# Patient Record
Sex: Female | Born: 1995 | Race: White | Hispanic: No | Marital: Single | Smoking: Former smoker
Health system: Southern US, Community
[De-identification: ages and names within clinical notes are randomized; demographics above are authoritative.]

## PROBLEM LIST (undated history)

## (undated) DIAGNOSIS — F419 Anxiety disorder, unspecified: Secondary | ICD-10-CM

## (undated) HISTORY — PX: NO PAST SURGERIES: SHX2092

## (undated) HISTORY — DX: Anxiety disorder, unspecified: F41.9

---
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
## 2015-01-11 DIAGNOSIS — E669 Obesity, unspecified: Secondary | ICD-10-CM | POA: Insufficient documentation

## 2015-03-01 IMAGING — CR DG ANKLE COMPLETE 3+V*L*
1 series · 5 of 5 positions shown · non-contrast
Comparison: none

REASON FOR EXAM: intermittent left ankle pain
COMMENTS:

PROCEDURE:     DXR - DXR ANKLE LEFT COMPLETE  - November 14, 2012  [DATE]
RESULT:     Comparison: None.

[Series 1: x ankle ap left · 0.14mm/px · 5 of 5 slices shown]
[im 1/5]
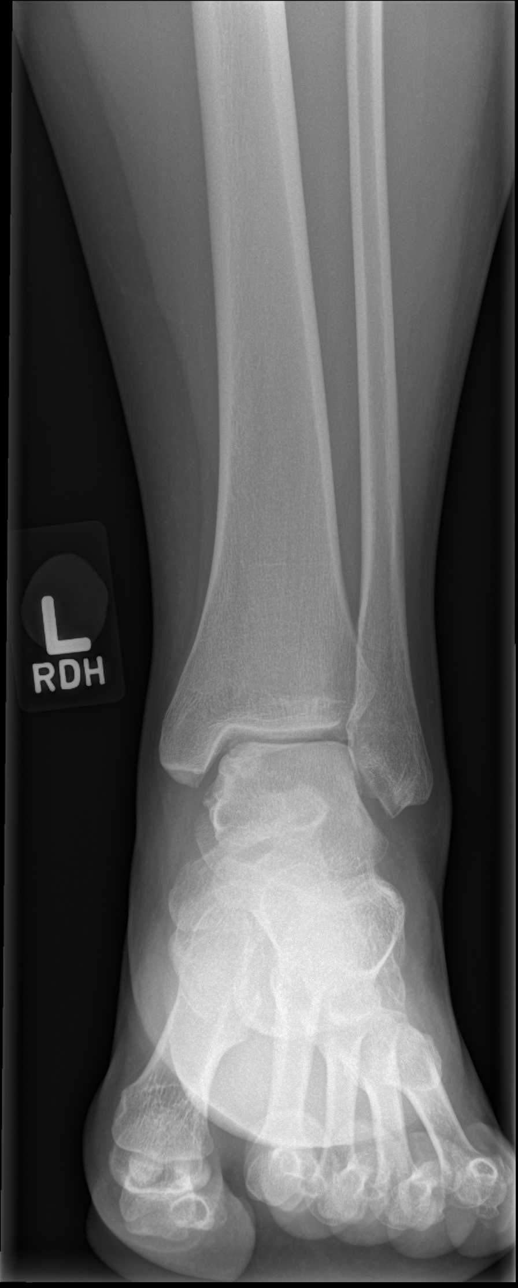
[im 2/5]
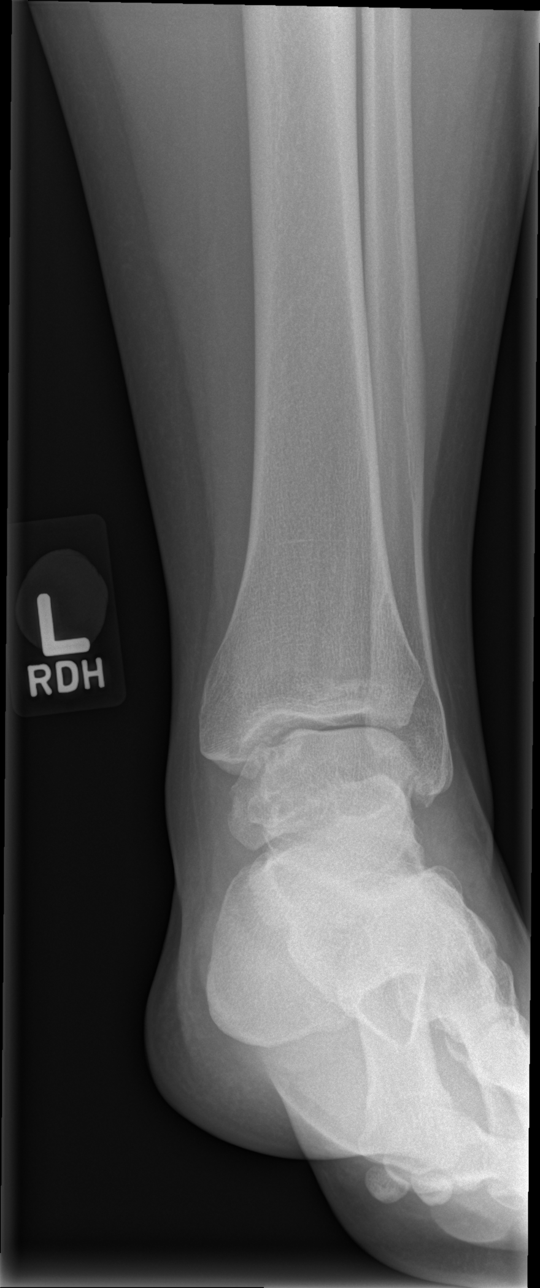
[im 3/5]
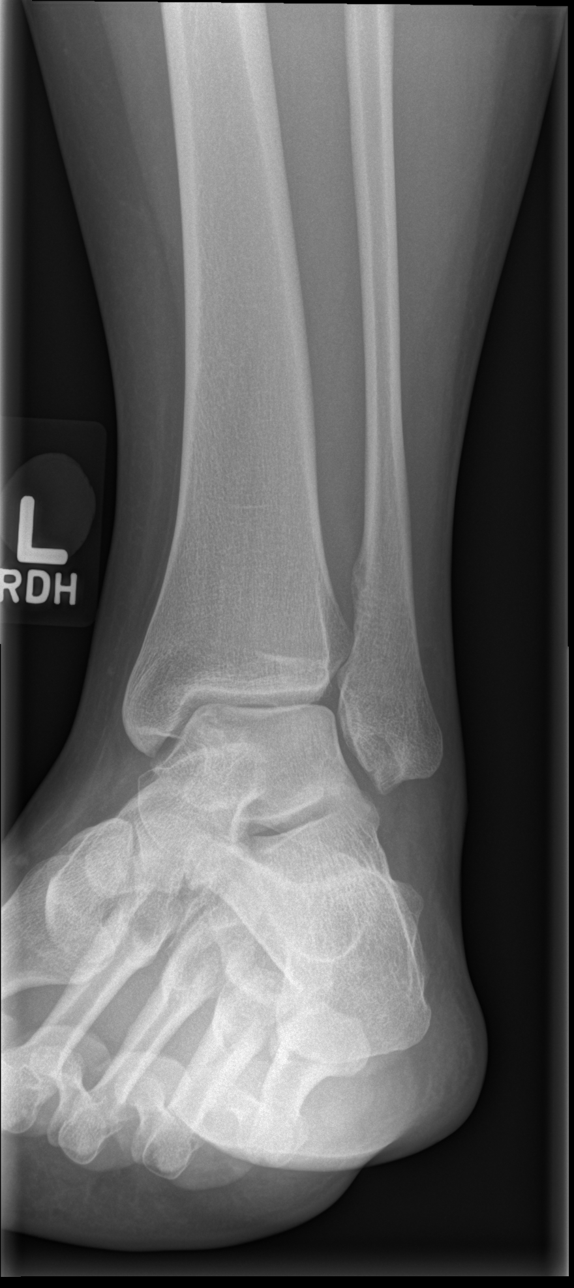
[im 4/5]
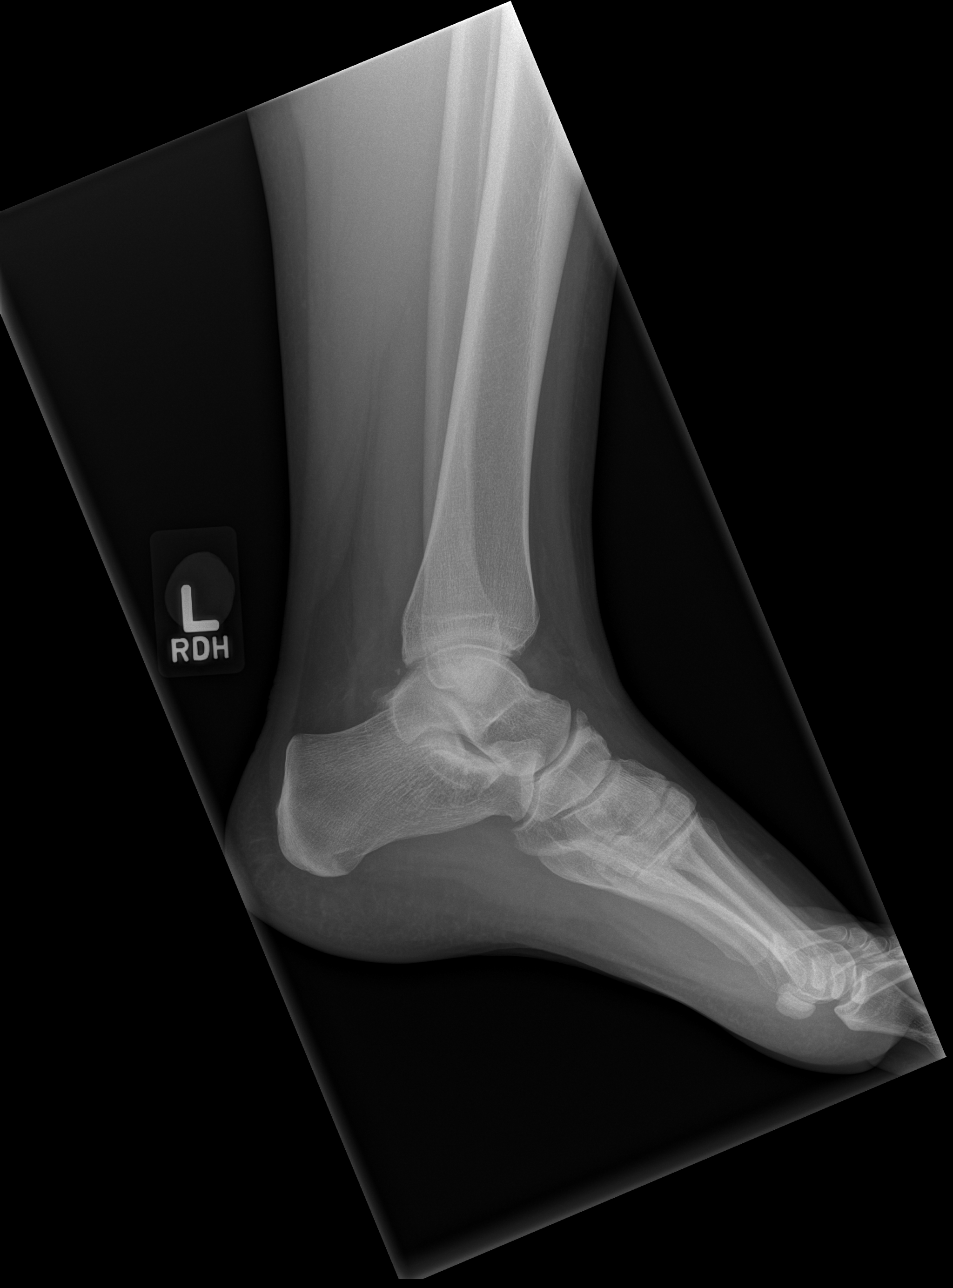
[im 5/5]
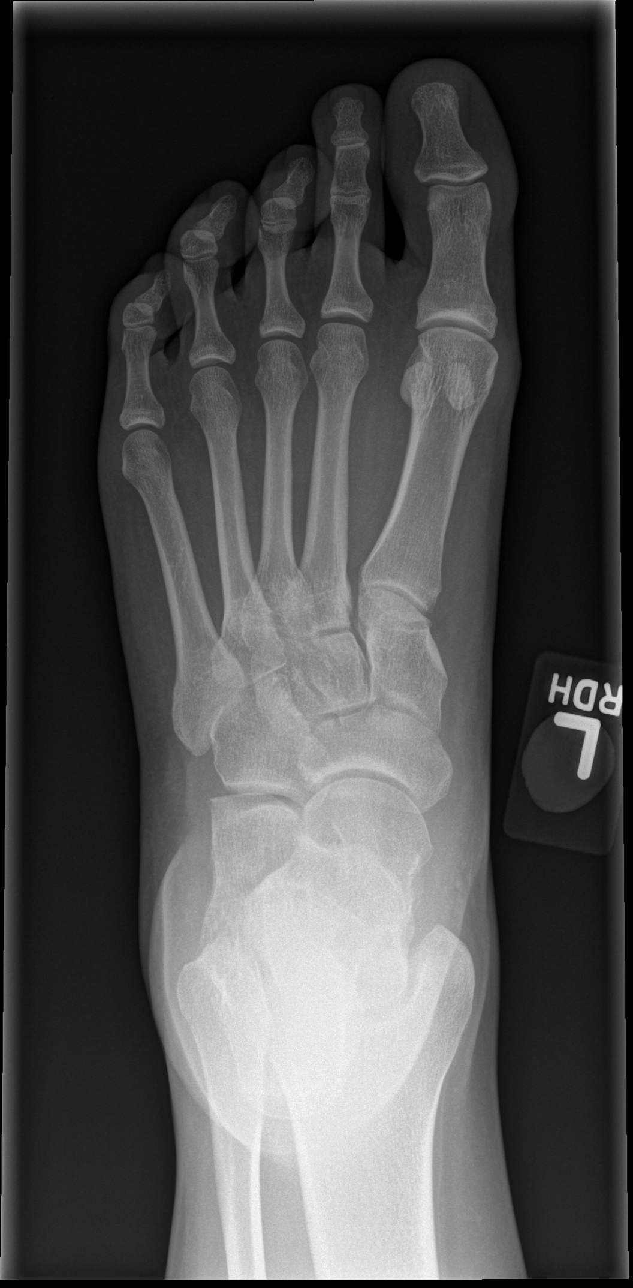

[5 of 5 positions shown; findings below may reference images not displayed]

FINDINGS: No acute fracture seen. There is mild irregularity of the medial talar dome
which could related to an osteochondral lesion. Tibiotalar joint space is
maintained. There is a tiny calcific density just posterior to the distal
tibia and view, which is nonspecific.
IMPRESSION: Findings which may represent sequela of an osteochondral lesion in the
medial talar dome. Further evaluation could be provided with MRI, as
indicated.

[REDACTED]

## 2015-09-24 ENCOUNTER — Telehealth: Payer: Self-pay | Admitting: Family Medicine

## 2015-09-24 NOTE — Telephone Encounter (Signed)
Please review and advise, KW 

## 2015-09-24 NOTE — Telephone Encounter (Signed)
Ok for 45 minute appointment (prefer not today)

## 2015-09-24 NOTE — Telephone Encounter (Signed)
This the daughter of Melissa FraiseSusan Stowe which is a pt of Bob's.  Darl PikesSusan told her daughter to call and set up a new patient appointment with Nadine CountsBob. No Rx.  [employer] insurance.  Can I set up a new patient appointment?  HQ#[phone]/XLCB#[phone]/MW

## 2015-09-24 NOTE — Telephone Encounter (Signed)
Pt is scheduled for a ne pt appointment for 09/25/2015@10 :00/MW

## 2015-09-25 ENCOUNTER — Ambulatory Visit
Admission: RE | Admit: 2015-09-25 | Discharge: 2015-09-25 | Disposition: A | Discharge status: Home / Self Care | Payer: 59 | Source: Ambulatory Visit | Attending: Family Medicine | Admitting: Family Medicine

## 2015-09-25 ENCOUNTER — Ambulatory Visit (INDEPENDENT_AMBULATORY_CARE_PROVIDER_SITE_OTHER): Payer: 59 | Admitting: Family Medicine

## 2015-09-25 ENCOUNTER — Encounter: Payer: Self-pay | Admitting: Family Medicine

## 2015-09-25 VITALS — BP 130/68 | HR 64 | Temp 98.4°F | Resp 16 | Ht 69.0 in | Wt 243.6 lb

## 2015-09-25 DIAGNOSIS — M25569 Pain in unspecified knee: Secondary | ICD-10-CM | POA: Insufficient documentation

## 2015-09-25 DIAGNOSIS — G8929 Other chronic pain: Secondary | ICD-10-CM

## 2015-09-25 MED ORDER — MELOXICAM 15 MG PO TABS: 15.0000 mg | ORAL_TABLET | Freq: Every day | ORAL | Status: DC

## 2015-09-25 NOTE — Patient Instructions (Signed)
We will call you with the x-ray report 

## 2015-09-25 NOTE — Progress Notes (Signed)
Subjective:     Patient ID: Melissa Small, female   DOB: 07-08-1995, 20 y.o.   MRN: [id number]  HPI  Chief Complaint  Patient presents with  . New Patient (Initial Visit)    Patient comes in office today to establish patient care, she reports that she had no previous PCP but was seen several times in past for acute visits and local health department. Patient reports that she has been having joint pain in her knees for some time now, she stats that she has taken in past Tramadol which has helped with pain.   States she has noticed the knee pain and at times swelling for the last two years. Reports clicking and occasional knee locking. No falls. Has tried ibuprofen in the past. Current job is @ [employer] where she must be on her feet all day.   Review of Systems  Psychiatric/Behavioral:       Reports rare panic attacks when she is in a large crowd or is upset. Has not been on medication for this in the past.       Objective:   Physical Exam  Constitutional: She appears well-developed and well-nourished. No distress.  Musculoskeletal:  Bilateral knee FROM without crepitus. Knee ligaments stable. No swelling or erythema noted. Mild tenderness left lateral knee. McMurray's test negative.  Skin:  Multiple piercings and tattoos.       Assessment:    1. Chronic knee pain, unspecified laterality: suspect intraarticular - meloxicam (MOBIC) 15 MG tablet; Take 1 tablet (15 mg total) by mouth daily.  Dispense: 30 tablet; Refill: 0 - DG Knee Complete 4 Views Left; Future - DG Knee Complete 4 Views Right; Future    Plan:    Further f/u pending x-ray report.

## 2015-09-26 ENCOUNTER — Telehealth: Payer: Self-pay

## 2015-09-26 NOTE — Telephone Encounter (Signed)
LMTCB-KW 

## 2015-09-26 NOTE — Telephone Encounter (Signed)
-----   Message from Anola Gurneyobert Chauvin, [place] sent at 09/25/2015  1:49 PM EDT ----- Right knee without significant bony abnormality. Left knee report pending.

## 2015-09-26 NOTE — Telephone Encounter (Signed)
Spoke with patient on phone and advised, she states that she did not get left knee because insurance did not cover for her to have both x-rays. She states she got right knee x-rayed because that is where she was having the most pain. Patient would like to know since report came back normal what she should do about her pain? KW

## 2015-09-27 NOTE — Telephone Encounter (Signed)
Try the meloxicam that I sent in for a week. If not helping call for orthopedic referral.

## 2015-09-27 NOTE — Telephone Encounter (Signed)
Patient has been advised. KW 

## 2015-10-08 ENCOUNTER — Other Ambulatory Visit: Payer: Self-pay | Admitting: Family Medicine

## 2015-10-08 ENCOUNTER — Telehealth: Payer: Self-pay | Admitting: Family Medicine

## 2015-10-08 DIAGNOSIS — M25562 Pain in left knee: Principal | ICD-10-CM

## 2015-10-08 DIAGNOSIS — M25561 Pain in right knee: Secondary | ICD-10-CM

## 2015-10-08 MED ORDER — NAPROXEN 500 MG PO TABS: 500.0000 mg | ORAL_TABLET | Freq: Two times a day (BID) | ORAL | Status: DC

## 2015-10-08 NOTE — Telephone Encounter (Signed)
I think she had a side effect to meloxicam not an allergic reaction. If she has tolerated Aleve in the past she should tolerate the Naproxen. If still afraid to try Naproxen may use Tylenol up to 3000 mg./day.

## 2015-10-08 NOTE — Telephone Encounter (Signed)
Pt called back regarding the meloxicam that she was prescribed about 2 weeks ago..  It is making her have hot flashes, and nauseated.    Please advise   [phone]  Thanks Barth Kirksteri

## 2015-10-08 NOTE — Telephone Encounter (Signed)
Let her know I have sent in Naproxen for her to try.

## 2015-10-08 NOTE — Telephone Encounter (Signed)
Please advise. KW 

## 2015-10-08 NOTE — Telephone Encounter (Signed)
Patient states that her mother is allergic to Nsaids and she has a lot of similar medical problems as her mother as was hesitant on taking another Nsaid. Patient believes that she might have sensitivity to drugs in this class since she has had nausea and hot flashes. Patient would like your opinion if she should continue with Nsaid and start Naproxen or if she should begin taking something else? Please advise. Kw

## 2015-10-09 NOTE — Telephone Encounter (Signed)
Patient has been advised. KW 

## 2015-11-08 ENCOUNTER — Telehealth: Payer: Self-pay | Admitting: Family Medicine

## 2015-11-08 NOTE — Telephone Encounter (Signed)
I do not commonly hear about anxiety after the Depo shot but it is listed as a possible side effect. If this is more than a normal anxiety about an injection may wish to consider a barrier method like diaphragm or condom. IUD may also be an option though don't know if the health department could provide that.

## 2015-11-08 NOTE — Telephone Encounter (Signed)
Patient reports that she is under some stress due to her grandmother being at the hospital. Patient denies anxiety due to injection. Patient reports that she has been using Depo for about 5 years. Patient reports she will call in the next few weeks to schedule an appt to talk about anxiety. sd

## 2015-11-08 NOTE — Telephone Encounter (Signed)
Pt would like to speak with a nurse about the Depo shot she gets from the [facility]. Pt stated that she has noticed an increase in anxiety after she gets the shot and wanted to see if this was normal or if she could try something else. Pt stated they she tried to reach someone at the [facility] but was unsuccessful. Please advise. Thanks TNP

## 2015-11-21 ENCOUNTER — Ambulatory Visit: Payer: 59 | Admitting: Family Medicine

## 2015-11-28 ENCOUNTER — Ambulatory Visit (INDEPENDENT_AMBULATORY_CARE_PROVIDER_SITE_OTHER): Payer: BLUE CROSS/BLUE SHIELD | Admitting: Family Medicine

## 2015-11-28 ENCOUNTER — Encounter: Payer: Self-pay | Admitting: Family Medicine

## 2015-11-28 VITALS — BP 130/82 | HR 76 | Temp 98.6°F | Resp 16 | Wt 249.2 lb

## 2015-11-28 DIAGNOSIS — M25569 Pain in unspecified knee: Secondary | ICD-10-CM

## 2015-11-28 DIAGNOSIS — G8929 Other chronic pain: Secondary | ICD-10-CM | POA: Diagnosis not present

## 2015-11-28 DIAGNOSIS — F410 Panic disorder [episodic paroxysmal anxiety] without agoraphobia: Secondary | ICD-10-CM | POA: Diagnosis not present

## 2015-11-28 MED ORDER — DULOXETINE HCL 30 MG PO CPEP: ORAL_CAPSULE | ORAL | 1 refills | Status: DC

## 2015-11-28 NOTE — Patient Instructions (Signed)
Let me know before two weeks if you can't tolerate the medication or it is too expensive.

## 2015-11-28 NOTE — Progress Notes (Signed)
Subjective:     Patient ID: Melissa Small, female   DOB: [birth year], 20 y.o.   MRN: [id number]  HPI  Chief Complaint  Patient presents with  . Panic Attack    Patient comes in office today to address symptoms of anxiety, patient reports for the past 5 months symptoms have gradually got worse. Patient states that nothing in particular is triggering her attacks and it has been associated with frequent crying spells and tightness in her chest. Patient states on average she has experinced two panic attacks a week.   Denies specific depression or self medication with caffeine or recreational drugs. Has not had any precipitating traumatic events in her life except the divorce of her parents when she was a young child. States she has seen a Veterinary surgeon in the past through the Health [facility]. Reports that her job is being affected by her attacks as she must "go to the back" when they occur.   Review of Systems     Objective:   Physical Exam  Constitutional: She appears well-developed and well-nourished. No distress.  Neck: No thyromegaly present.  Cardiovascular: Normal rate and regular rhythm.   Pulmonary/Chest: Breath sounds normal.  Psychiatric:  Anxious but relieved to be able to talk about her anxiety.       Assessment:    1. Panic attacks - DULoxetine (CYMBALTA) 30 MG capsule; Start on one pill daily, may increase to two pills daily after one week.  Dispense: 30 capsule; Refill: 1  2. Chronic knee pain, unspecified laterality - DULoxetine (CYMBALTA) 30 MG capsule; Start on one pill daily, may increase to two pills daily after one week.  Dispense: 30 capsule; Refill: 1    Plan:    F/u in two weeks but may call earlier if not tolerating the medication.

## 2015-12-17 ENCOUNTER — Encounter: Payer: Self-pay | Admitting: Family Medicine

## 2015-12-17 ENCOUNTER — Ambulatory Visit (INDEPENDENT_AMBULATORY_CARE_PROVIDER_SITE_OTHER): Payer: BLUE CROSS/BLUE SHIELD | Admitting: Family Medicine

## 2015-12-17 VITALS — BP 126/80 | HR 82 | Temp 98.4°F | Resp 16 | Wt 246.0 lb

## 2015-12-17 DIAGNOSIS — F410 Panic disorder [episodic paroxysmal anxiety] without agoraphobia: Secondary | ICD-10-CM

## 2015-12-17 NOTE — Patient Instructions (Signed)
Phone followup in 2-3 weeks

## 2015-12-17 NOTE — Progress Notes (Signed)
Subjective:     Patient ID: Melissa Small, female   DOB: 1995-08-10, 20 y.o.   MRN: [id number]  HPI  Chief Complaint  Patient presents with  . Anxiety    follow up. Started Cymbalta. Pt seems to be better but unsure if it is because she is not in the stressful situation anymore or if it is the medication. No know side effects.  States she got out of a 2 year relationship. Reports occasional break through panic sx. Has been on 60 mg. For 1.5 weeks.   Review of Systems     Objective:   Physical Exam  Constitutional: She appears well-developed and well-nourished.  Psychiatric: She has a normal mood and affect. Her behavior is normal.       Assessment:    1. Panic attacks: improved     Plan:    Continue current dose of medication with phone f/u in 2-3 weeks.

## 2015-12-28 ENCOUNTER — Telehealth: Payer: Self-pay | Admitting: Family Medicine

## 2015-12-28 ENCOUNTER — Other Ambulatory Visit: Payer: Self-pay | Admitting: Family Medicine

## 2015-12-28 DIAGNOSIS — G8929 Other chronic pain: Secondary | ICD-10-CM

## 2015-12-28 DIAGNOSIS — F410 Panic disorder [episodic paroxysmal anxiety] without agoraphobia: Secondary | ICD-10-CM

## 2015-12-28 DIAGNOSIS — M25569 Pain in unspecified knee: Secondary | ICD-10-CM

## 2015-12-28 MED ORDER — DULOXETINE HCL 30 MG PO CPEP: ORAL_CAPSULE | ORAL | 0 refills | Status: DC

## 2015-12-28 NOTE — Telephone Encounter (Signed)
Go up to three pills daily. I have sent in a new prescription. Phone f/u in 2 weeks.

## 2015-12-28 NOTE — Telephone Encounter (Signed)
NA. Voice mail not set up. sd 

## 2015-12-28 NOTE — Telephone Encounter (Signed)
Pt stated that she was advised to call back 2 weeks from her LOV. Pt stated she has been taking DULoxetine (CYMBALTA) 30 MG capsule as directed and increased to 2 pills a day after the first week. Pt stated that her anxiety and panic attacks haven't improved. [facility] [address] Please advise. Thanks TNP

## 2015-12-31 NOTE — Telephone Encounter (Signed)
Patient was advised. KW 

## 2016-01-22 ENCOUNTER — Telehealth: Payer: Self-pay | Admitting: Family Medicine

## 2016-01-22 NOTE — Telephone Encounter (Signed)
Pt was to call back to let you know how the generic cymbalta is doing.  Patient states she can not see any difference.  Please advise.  [phone].  Please leave a message if she does not answer  Thank sTeri

## 2016-01-23 NOTE — Telephone Encounter (Signed)
Pt is returning call.  NF#[phone]/IOCB#[phone]/MW

## 2016-01-23 NOTE — Telephone Encounter (Signed)
No answer x 2 with no voice mail set up.

## 2016-01-24 ENCOUNTER — Other Ambulatory Visit: Payer: Self-pay | Admitting: Family Medicine

## 2016-01-24 DIAGNOSIS — G8929 Other chronic pain: Secondary | ICD-10-CM

## 2016-01-24 DIAGNOSIS — F410 Panic disorder [episodic paroxysmal anxiety] without agoraphobia: Secondary | ICD-10-CM

## 2016-01-24 DIAGNOSIS — M25569 Pain in unspecified knee: Secondary | ICD-10-CM

## 2016-01-24 DIAGNOSIS — M25562 Pain in left knee: Secondary | ICD-10-CM

## 2016-01-24 DIAGNOSIS — M25561 Pain in right knee: Secondary | ICD-10-CM

## 2016-01-24 MED ORDER — HYDROXYZINE HCL 25 MG PO TABS: 25.0000 mg | ORAL_TABLET | Freq: Four times a day (QID) | ORAL | 0 refills | Status: DC | PRN

## 2016-01-24 NOTE — Telephone Encounter (Signed)
States she wishes referral to orthopedics for chronic knee pain. States they occasionally will give way. Currently having two breakthrough panic attacks weekly despite high dose Cymbalta. Will try prn hydroxyzine. Also consider clonazepam.

## 2016-01-28 ENCOUNTER — Telehealth: Payer: Self-pay | Admitting: Family Medicine

## 2016-01-28 NOTE — Telephone Encounter (Signed)
Patient states that the hydrOXYzine (ATARAX/VISTARIL) 25 MG tablet    Is making her feel weird making her have anxiety, her heart rate was up, sweating please advice on what to do  Patient states to leave message if she does not answer  [phone]

## 2016-01-28 NOTE — Telephone Encounter (Signed)
Does she wish to try a tranquilizing medication like clonazepam?

## 2016-01-29 NOTE — Telephone Encounter (Signed)
Unable to reach patient at this time voicemail box is not set up yet. Will try contacting patient again at a later time. KW

## 2016-01-30 NOTE — Telephone Encounter (Signed)
Spoke with patient on the phone who is willing to begin trying Clonazepam. She states that she had only took the hydroxyzine twice. [facility]. KW

## 2016-01-31 ENCOUNTER — Other Ambulatory Visit: Payer: Self-pay | Admitting: Family Medicine

## 2016-01-31 MED ORDER — CLONAZEPAM 0.5 MG PO TABS: 0.5000 mg | ORAL_TABLET | Freq: Two times a day (BID) | ORAL | 0 refills | Status: DC | PRN

## 2016-01-31 NOTE — Progress Notes (Signed)
Called medication into the patient's pharmacy.

## 2016-01-31 NOTE — Telephone Encounter (Signed)
Will have clonazepam called in.

## 2016-02-05 ENCOUNTER — Other Ambulatory Visit: Payer: Self-pay | Admitting: Family Medicine

## 2016-02-05 DIAGNOSIS — F410 Panic disorder [episodic paroxysmal anxiety] without agoraphobia: Secondary | ICD-10-CM

## 2016-02-06 ENCOUNTER — Other Ambulatory Visit: Payer: Self-pay | Admitting: Family Medicine

## 2016-02-07 ENCOUNTER — Other Ambulatory Visit: Payer: Self-pay | Admitting: Family Medicine

## 2016-02-08 NOTE — Telephone Encounter (Signed)
Prescription has been called into pharmacy. KW 

## 2016-02-18 LAB — HM PAP SMEAR: HM Pap smear: NEGATIVE

## 2016-03-25 ENCOUNTER — Other Ambulatory Visit: Payer: Self-pay | Admitting: Family Medicine

## 2016-03-26 NOTE — Telephone Encounter (Signed)
Rx called in-aa 

## 2016-03-31 ENCOUNTER — Telehealth: Payer: Self-pay | Admitting: Family Medicine

## 2016-04-25 ENCOUNTER — Other Ambulatory Visit: Payer: Self-pay | Admitting: Family Medicine

## 2016-05-01 ENCOUNTER — Other Ambulatory Visit: Payer: Self-pay | Admitting: Family Medicine

## 2016-05-01 NOTE — Telephone Encounter (Signed)
Last ov 12/07/15. Please review. sd

## 2016-05-08 ENCOUNTER — Ambulatory Visit: Payer: BLUE CROSS/BLUE SHIELD | Admitting: Family Medicine

## 2016-05-24 ENCOUNTER — Other Ambulatory Visit: Payer: Self-pay | Admitting: Family Medicine

## 2016-05-26 NOTE — Telephone Encounter (Signed)
Prescription has been called into pharmacy. KW 

## 2016-06-25 ENCOUNTER — Other Ambulatory Visit: Payer: Self-pay | Admitting: Family Medicine

## 2016-06-25 NOTE — Telephone Encounter (Signed)
Left message for patient notifying her that prescription has been called into pharmacy. KW

## 2016-06-28 ENCOUNTER — Other Ambulatory Visit: Payer: Self-pay | Admitting: Family Medicine

## 2016-06-28 DIAGNOSIS — M25569 Pain in unspecified knee: Secondary | ICD-10-CM

## 2016-06-28 DIAGNOSIS — F410 Panic disorder [episodic paroxysmal anxiety] without agoraphobia: Secondary | ICD-10-CM

## 2016-06-28 DIAGNOSIS — G8929 Other chronic pain: Secondary | ICD-10-CM

## 2016-07-11 ENCOUNTER — Telehealth: Payer: Self-pay | Admitting: Emergency Medicine

## 2016-07-11 NOTE — Telephone Encounter (Signed)
See phone message

## 2016-07-11 NOTE — Telephone Encounter (Signed)
Please review-aa 

## 2016-07-11 NOTE — Telephone Encounter (Signed)
LMTCB-KW 

## 2016-07-11 NOTE — Telephone Encounter (Signed)
Pt called about her PA for duloxetine 30 mg that you have been working on. She reports that she needs a 90 day supply for this because she is leaving [facility] and insurance with her new job will not go into effect when she starts.

## 2016-07-14 NOTE — Telephone Encounter (Signed)
LMTCB-KW 

## 2016-07-15 NOTE — Telephone Encounter (Signed)
Pre authorization, pending faxed 07/14/16.KW

## 2016-07-16 ENCOUNTER — Other Ambulatory Visit: Payer: Self-pay | Admitting: Family Medicine

## 2016-07-16 ENCOUNTER — Telehealth: Payer: Self-pay | Admitting: Family Medicine

## 2016-07-16 DIAGNOSIS — G8929 Other chronic pain: Secondary | ICD-10-CM

## 2016-07-16 DIAGNOSIS — M25569 Pain in unspecified knee: Secondary | ICD-10-CM

## 2016-07-16 DIAGNOSIS — F410 Panic disorder [episodic paroxysmal anxiety] without agoraphobia: Secondary | ICD-10-CM

## 2016-07-16 NOTE — Telephone Encounter (Signed)
Pt states she is returning a call to [place].  CB#[phone]/MW  Pt contacted office for refill request on the following medications:  clonazePAM (KLONOPIN) 0.5 MG tablet.  [facility] [place].  [phone]#[phone]/MW

## 2016-07-16 NOTE — Telephone Encounter (Signed)
Patient called back office and states that [facility] [address] stated they never received prescription for Cymbalta only Meloxicam. Will call back pharmacy to clarify issue. KW

## 2016-07-16 NOTE — Telephone Encounter (Signed)
Spoke with [facility] on [address] they stated that patient prescriptions was available for pick up at there store but patient had contacted there store and told them [facility] would [facility] [place] and for them to send prescription to that pharmacy. Patient is now saying on the phone that she needs a qty for 270 instead of 90 to cover her for 3 months. She [facility] [place]. I advised patient that you were out of office this afternoon and will address in morning. KW

## 2016-07-16 NOTE — Telephone Encounter (Signed)
Spoke with patient on phone she states that she was [facility] [place] because she use to working at the one on [address] and states that she has problems now with that pharmacy. I advised patient that medication did not need pre-authorization according to response from insurance company from pre-authorization that was faxed on 07/14/16. Patient states that she had already spoke with [facility] [place] and they authorized to fill medication with a qty of 30. Patient states that she is requesting qty of 6690 because insurance will be terminated soon since she will be starting new job. I advised [facility] to [facility] [address] to see if prescription there is still available for pick up. She was instructed to call the office back if she ran into a issue with [facility]. KW

## 2016-07-17 ENCOUNTER — Other Ambulatory Visit: Payer: Self-pay | Admitting: Family Medicine

## 2016-07-17 DIAGNOSIS — F410 Panic disorder [episodic paroxysmal anxiety] without agoraphobia: Secondary | ICD-10-CM

## 2016-07-17 DIAGNOSIS — M25569 Pain in unspecified knee: Secondary | ICD-10-CM

## 2016-07-17 DIAGNOSIS — G8929 Other chronic pain: Secondary | ICD-10-CM

## 2016-07-17 MED ORDER — DULOXETINE HCL 30 MG PO CPEP: 90.0000 mg | ORAL_CAPSULE | Freq: Every day | ORAL | 0 refills | Status: DC

## 2016-07-17 NOTE — Telephone Encounter (Signed)
LMTCB

## 2016-07-17 NOTE — Telephone Encounter (Signed)
[facility] [place]. Expect office visit in the next few weeks.

## 2016-07-21 NOTE — Telephone Encounter (Signed)
LMCTB-KW

## 2016-07-22 NOTE — Telephone Encounter (Signed)
Patient has picked up Rx from pharmacy.KW

## 2016-07-25 ENCOUNTER — Ambulatory Visit: Payer: BLUE CROSS/BLUE SHIELD | Admitting: Family Medicine

## 2016-10-21 ENCOUNTER — Telehealth: Payer: Self-pay | Admitting: Family Medicine

## 2016-10-21 ENCOUNTER — Other Ambulatory Visit: Payer: Self-pay | Admitting: Family Medicine

## 2016-10-21 DIAGNOSIS — F410 Panic disorder [episodic paroxysmal anxiety] without agoraphobia: Secondary | ICD-10-CM

## 2016-10-21 DIAGNOSIS — M25569 Pain in unspecified knee: Secondary | ICD-10-CM

## 2016-10-21 DIAGNOSIS — G8929 Other chronic pain: Secondary | ICD-10-CM

## 2016-10-21 MED ORDER — DULOXETINE HCL 30 MG PO CPEP: 90.0000 mg | ORAL_CAPSULE | Freq: Every day | ORAL | 0 refills | Status: DC

## 2016-10-21 NOTE — Telephone Encounter (Addendum)
[facility] faxed a refill request on the following medications:  DULoxetine (CYMBALTA) 30 MG capsule.  Take 3 capsules by mouth daily.  90 day supply.  [facility] [place]/MW

## 2016-10-21 NOTE — Telephone Encounter (Signed)
Refilled duloxetine x 3 months. Expect office visit prior to next refill.

## 2016-10-21 NOTE — Telephone Encounter (Signed)
Last filled 07/17/16, please review chart and advise. KW

## 2016-10-22 DIAGNOSIS — Z3042 Encounter for surveillance of injectable contraceptive: Secondary | ICD-10-CM | POA: Diagnosis not present

## 2016-10-22 DIAGNOSIS — Z3009 Encounter for other general counseling and advice on contraception: Secondary | ICD-10-CM | POA: Diagnosis not present

## 2016-10-30 ENCOUNTER — Encounter: Payer: Self-pay | Admitting: Family Medicine

## 2016-10-30 ENCOUNTER — Ambulatory Visit (INDEPENDENT_AMBULATORY_CARE_PROVIDER_SITE_OTHER): Payer: BLUE CROSS/BLUE SHIELD | Admitting: Family Medicine

## 2016-10-30 VITALS — BP 130/94 | HR 90 | Temp 98.4°F | Resp 16 | Wt 239.6 lb

## 2016-10-30 DIAGNOSIS — R030 Elevated blood-pressure reading, without diagnosis of hypertension: Secondary | ICD-10-CM

## 2016-10-30 DIAGNOSIS — F410 Panic disorder [episodic paroxysmal anxiety] without agoraphobia: Secondary | ICD-10-CM | POA: Diagnosis not present

## 2016-10-30 MED ORDER — CLONAZEPAM 0.5 MG PO TABS: 0.5000 mg | ORAL_TABLET | Freq: Two times a day (BID) | ORAL | 0 refills | Status: DC | PRN

## 2016-10-30 NOTE — Progress Notes (Signed)
Subjective:     Patient ID: Melissa Small, female   DOB: 03-17-1996, 21 y.o.   MRN: [id number]  HPI  Chief Complaint  Patient presents with  . Panic Attack    Patient returns to office today for follow up visit, last office visit was 12/17/15 and patients conditon was improving. Patient reports good compliacne and tolerance on Cymbalta 30mg  and Clonazepam 0.5mg . Patient states that she has less stress in her life but still will have random panic attacks at least once a week.   . Hypertension    Patient would like to discuss fluctuating blood pressure, patient is currently on Depo Provera for contraception management. Patient states that she has been getting her shots at local health department and it has been noted that patients blood pressure has been in hypertensive range > 140/90.  States she feels well. Has lost weight with vegan diet and walking her chihuahua for 15-40 minutes daily. She has also switched to a less physically stressful job in [employer] fulltime. Reports that she no longer is in a stressful relationship and is only having one breakthrough panic attack a week. Rarely uses clonazepam. She is using E cigarettes without nicotine at this time.   Review of Systems     Objective:   Physical Exam  Constitutional: She appears well-developed and well-nourished. No distress.  Cardiovascular: Normal rate and regular rhythm.   Pulmonary/Chest: Breath sounds normal.  Musculoskeletal: She exhibits no edema (of lower extremities).       Assessment:    1. Panic attacks: continue duloxetine at present dose - clonazePAM (KLONOPIN) 0.5 MG tablet; Take 1 tablet (0.5 mg total) by mouth 2 (two) times daily as needed.  Dispense: 60 tablet; Refill: 0  2. Elevated blood-pressure reading, without diagnosis of hypertension    Plan:    Discussed occasional nurse bp checks. Suggested this may resolve with more weight loss.    [facility]

## 2016-10-30 NOTE — Patient Instructions (Signed)
May wish to have occasional nurse bp checks here in the office. Continue your weight loss efforts and avoid nicotine. Do [facility] with [facility] for a physical.

## 2016-10-31 ENCOUNTER — Encounter: Payer: Self-pay | Admitting: Family Medicine

## 2016-11-29 ENCOUNTER — Other Ambulatory Visit: Payer: Self-pay | Admitting: Family Medicine

## 2016-11-29 DIAGNOSIS — F410 Panic disorder [episodic paroxysmal anxiety] without agoraphobia: Secondary | ICD-10-CM

## 2016-12-01 ENCOUNTER — Other Ambulatory Visit: Payer: Self-pay | Admitting: Family Medicine

## 2016-12-01 NOTE — Telephone Encounter (Signed)
Pt states that this time she did take Clonazepam 1 tablet twice daily every day-aa

## 2016-12-01 NOTE — Telephone Encounter (Signed)
rx called in-aa 

## 2016-12-01 NOTE — Telephone Encounter (Signed)
Please call her and see if she really needs a refill. She previously told me she was rarely using clonazepam but now ? has used all 60 in a month?

## 2016-12-01 NOTE — Telephone Encounter (Signed)
lmtcb-aa 

## 2017-01-09 DIAGNOSIS — Z3009 Encounter for other general counseling and advice on contraception: Secondary | ICD-10-CM | POA: Diagnosis not present

## 2017-01-09 DIAGNOSIS — Z3042 Encounter for surveillance of injectable contraceptive: Secondary | ICD-10-CM | POA: Diagnosis not present

## 2017-02-11 ENCOUNTER — Other Ambulatory Visit: Payer: Self-pay | Admitting: Family Medicine

## 2017-02-11 ENCOUNTER — Encounter: Payer: Self-pay | Admitting: Family Medicine

## 2017-02-11 DIAGNOSIS — F410 Panic disorder [episodic paroxysmal anxiety] without agoraphobia: Secondary | ICD-10-CM

## 2017-02-11 DIAGNOSIS — M25569 Pain in unspecified knee: Secondary | ICD-10-CM

## 2017-02-11 DIAGNOSIS — G8929 Other chronic pain: Secondary | ICD-10-CM

## 2017-02-12 ENCOUNTER — Other Ambulatory Visit: Payer: Self-pay | Admitting: Family Medicine

## 2017-02-12 DIAGNOSIS — G8929 Other chronic pain: Secondary | ICD-10-CM

## 2017-02-12 DIAGNOSIS — F410 Panic disorder [episodic paroxysmal anxiety] without agoraphobia: Secondary | ICD-10-CM

## 2017-02-12 DIAGNOSIS — M25569 Pain in unspecified knee: Secondary | ICD-10-CM

## 2017-02-13 ENCOUNTER — Other Ambulatory Visit: Payer: Self-pay | Admitting: Family Medicine

## 2017-02-13 NOTE — Telephone Encounter (Signed)
This was sent to [facility] in [place] on 10/10. Can she pick it up there or get the prescription transferred?

## 2017-02-13 NOTE — Telephone Encounter (Signed)
Please review. KW 

## 2017-02-17 NOTE — Telephone Encounter (Signed)
lmtcb-Timber Lucarelli V Annissa Andreoni, RMA  

## 2017-02-18 NOTE — Telephone Encounter (Signed)
Patient has picked up medication 

## 2017-02-19 ENCOUNTER — Encounter: Payer: Self-pay | Admitting: Family Medicine

## 2017-02-19 ENCOUNTER — Ambulatory Visit (INDEPENDENT_AMBULATORY_CARE_PROVIDER_SITE_OTHER): Payer: BLUE CROSS/BLUE SHIELD | Admitting: Family Medicine

## 2017-02-19 VITALS — BP 142/98 | HR 82 | Temp 98.5°F | Resp 16 | Wt 237.0 lb

## 2017-02-19 DIAGNOSIS — Z23 Encounter for immunization: Secondary | ICD-10-CM

## 2017-02-19 DIAGNOSIS — N3090 Cystitis, unspecified without hematuria: Secondary | ICD-10-CM

## 2017-02-19 LAB — POCT URINALYSIS DIPSTICK
Glucose, UA: NEGATIVE
KETONES UA: NEGATIVE
LEUKOCYTES UA: NEGATIVE
Nitrite, UA: NEGATIVE
Protein, UA: 100
Spec Grav, UA: >=1.03 — AB (ref 1.010–1.025)
Urobilinogen, UA: 0.2 E.U./dL
pH, UA: 6 (ref 5.0–8.0)

## 2017-02-19 MED ORDER — CEPHALEXIN 500 MG PO CAPS: 500.0000 mg | ORAL_CAPSULE | Freq: Two times a day (BID) | ORAL | 0 refills | Status: DC

## 2017-02-19 NOTE — Progress Notes (Signed)
Subjective:     Patient ID: Melissa Small, female   DOB: Apr 23, 1996, 21 y.o.   MRN: [id number]  HPI  Chief Complaint  Patient presents with  . Urinary Tract Infection    Patient comes in office today with concerns of a urinary tract infection for teh past two weeks. Patient reports symptoms of hematuria, frequency, and urgency. Patient reports that she has taken otc cranberry pills and AZO.   States she has not been seen for urinary infections but usually self treats at home. Denis vaginal discharge; currently on Depo.   Review of Systems  Psychiatric/Behavioral:       Reports a "random" panic attack yesterday but is usually well controlled with duloxetine. Uses clonazepam on an as needed basis       Objective:   Physical Exam  Constitutional: She appears well-developed and well-nourished. No distress.  Genitourinary:  Genitourinary Comments: No cva tenderness       Assessment:    1. Need for influenza vaccination - Flu Vaccine QUAD 36+ mos IM  2. Cystitis - POCT urinalysis dipstick - Urine Culture; Future - cephALEXin (KEFLEX) 500 MG capsule; Take 1 capsule (500 mg total) by mouth 2 (two) times daily.  Dispense: 14 capsule; Refill: 0     Plan:    Further f/u  Pending urine culture results.

## 2017-02-19 NOTE — Patient Instructions (Signed)
We will call you with the urine culture result. 

## 2017-02-21 LAB — URINE CULTURE
MICRO NUMBER: 81166087
SPECIMEN QUALITY: ADEQUATE

## 2017-02-23 ENCOUNTER — Telehealth: Payer: Self-pay

## 2017-02-23 ENCOUNTER — Encounter: Payer: Self-pay | Admitting: Family Medicine

## 2017-02-23 NOTE — Telephone Encounter (Signed)
-----   Message from Anola Gurneyobert Chauvin, [place] sent at 02/23/2017  7:40 AM EDT ----- A strep species was detected in your urine-are you doing better on the antibiotic?

## 2017-02-23 NOTE — Telephone Encounter (Signed)
LMTCB 02/23/2017  Thanks,   -Kenzey Birkland  

## 2017-02-25 ENCOUNTER — Other Ambulatory Visit: Payer: Self-pay | Admitting: Family Medicine

## 2017-02-25 ENCOUNTER — Encounter: Payer: Self-pay | Admitting: Family Medicine

## 2017-02-25 DIAGNOSIS — F419 Anxiety disorder, unspecified: Secondary | ICD-10-CM

## 2017-02-25 NOTE — Telephone Encounter (Signed)
LMTCB-KW 

## 2017-03-04 NOTE — Telephone Encounter (Signed)
Messaged pt through Mychart at her request regarding these results.

## 2017-03-06 ENCOUNTER — Other Ambulatory Visit: Payer: Self-pay | Admitting: Family Medicine

## 2017-03-06 DIAGNOSIS — F410 Panic disorder [episodic paroxysmal anxiety] without agoraphobia: Secondary | ICD-10-CM

## 2017-03-06 NOTE — Telephone Encounter (Signed)
rx called in-Arlyn Bumpus V Taedyn Glasscock, RMA  

## 2017-03-19 ENCOUNTER — Ambulatory Visit: Payer: BLUE CROSS/BLUE SHIELD | Admitting: Psychiatry

## 2017-04-01 DIAGNOSIS — R030 Elevated blood-pressure reading, without diagnosis of hypertension: Secondary | ICD-10-CM | POA: Diagnosis not present

## 2017-04-01 DIAGNOSIS — Z3009 Encounter for other general counseling and advice on contraception: Secondary | ICD-10-CM | POA: Diagnosis not present

## 2017-04-01 DIAGNOSIS — Z30013 Encounter for initial prescription of injectable contraceptive: Secondary | ICD-10-CM | POA: Diagnosis not present

## 2017-04-02 ENCOUNTER — Encounter: Payer: Self-pay | Admitting: Family Medicine

## 2017-04-02 ENCOUNTER — Ambulatory Visit (INDEPENDENT_AMBULATORY_CARE_PROVIDER_SITE_OTHER): Payer: BLUE CROSS/BLUE SHIELD | Admitting: Family Medicine

## 2017-04-02 VITALS — BP 124/88 | HR 88 | Temp 98.5°F | Resp 16 | Wt 243.6 lb

## 2017-04-02 DIAGNOSIS — Z0130 Encounter for examination of blood pressure without abnormal findings: Secondary | ICD-10-CM

## 2017-04-02 NOTE — Progress Notes (Signed)
Subjective:     Patient ID: Melissa Small, female   DOB: 09-Oct-1995, 21 y.o.   MRN: [id number] Chief Complaint  Patient presents with  . Hypertension    Patient comes in office today with concerns of elevated blood pressure since Jan. Patient has been keeping log and systolic readings have ranged [phone], and diastolic reading ranging from [phone]. Patient [facility] in [facility] department 150/108.   States she gets her blood pressure checked with an electronic cuff when she gets her Depo shots. Reports working out 3 x week at the gym both aerobic and weights. She does vape using a low nicotine product but does not use salt. Did not vape prior to getting her bp taken. Maternal hx of hypertension. HPI   Review of Systems     Objective:   Physical Exam  Constitutional: She appears well-developed and well-nourished. No distress.  Cardiovascular: Normal rate and regular rhythm.  Pulmonary/Chest: Breath sounds normal.  Musculoskeletal: She exhibits no edema (of lower extremities).       Assessment:    1. Blood pressure check: normotensive     Plan:    Discussed losing 5-10%of her weight and minimizing nicotine use. Nurse bp check in the next month.

## 2017-04-02 NOTE — Patient Instructions (Addendum)
Discussed coming in for a nurse bp check in the next month. Continue to workout and try to reduce your weight by 5 to 10%. Minimize nicotine use.

## 2017-06-01 ENCOUNTER — Other Ambulatory Visit: Payer: Self-pay | Admitting: Family Medicine

## 2017-06-01 DIAGNOSIS — F410 Panic disorder [episodic paroxysmal anxiety] without agoraphobia: Secondary | ICD-10-CM

## 2017-06-02 NOTE — Telephone Encounter (Signed)
[facility]. KW

## 2017-06-17 DIAGNOSIS — Z30013 Encounter for initial prescription of injectable contraceptive: Secondary | ICD-10-CM | POA: Diagnosis not present

## 2017-06-17 DIAGNOSIS — Z3009 Encounter for other general counseling and advice on contraception: Secondary | ICD-10-CM | POA: Diagnosis not present

## 2017-06-26 ENCOUNTER — Telehealth: Payer: Self-pay

## 2017-06-26 NOTE — Telephone Encounter (Signed)
Started an hour ago with Blurry vision, "flutter", pressure at the back of her eye,left, "flutter" moves up on the top of her eye like if there's fan. But she doesn't have a fan in her room.No chest pain, SOB,headache,lighthededness.dizziness. Reports she usually gets the "flutter" with panic attacks? But this feels different from that. Per Dr Sherrie MustacheFisher patient to go the Urgent care.Per patient High blood pressure runs in the family.

## 2017-07-11 ENCOUNTER — Other Ambulatory Visit: Payer: Self-pay | Admitting: Family Medicine

## 2017-07-11 DIAGNOSIS — F410 Panic disorder [episodic paroxysmal anxiety] without agoraphobia: Secondary | ICD-10-CM

## 2017-08-17 ENCOUNTER — Other Ambulatory Visit: Payer: Self-pay | Admitting: Family Medicine

## 2017-08-17 DIAGNOSIS — F410 Panic disorder [episodic paroxysmal anxiety] without agoraphobia: Secondary | ICD-10-CM

## 2017-08-25 ENCOUNTER — Encounter: Payer: Self-pay | Admitting: Family Medicine

## 2017-09-04 DIAGNOSIS — Z30013 Encounter for initial prescription of injectable contraceptive: Secondary | ICD-10-CM | POA: Diagnosis not present

## 2017-09-04 DIAGNOSIS — Z3009 Encounter for other general counseling and advice on contraception: Secondary | ICD-10-CM | POA: Diagnosis not present

## 2017-09-10 DIAGNOSIS — S46812A Strain of other muscles, fascia and tendons at shoulder and upper arm level, left arm, initial encounter: Secondary | ICD-10-CM | POA: Diagnosis not present

## 2017-11-15 ENCOUNTER — Other Ambulatory Visit: Payer: Self-pay | Admitting: Family Medicine

## 2017-11-15 DIAGNOSIS — F410 Panic disorder [episodic paroxysmal anxiety] without agoraphobia: Secondary | ICD-10-CM

## 2017-11-23 DIAGNOSIS — Z3009 Encounter for other general counseling and advice on contraception: Secondary | ICD-10-CM | POA: Diagnosis not present

## 2017-11-23 DIAGNOSIS — Z30013 Encounter for initial prescription of injectable contraceptive: Secondary | ICD-10-CM | POA: Diagnosis not present

## 2017-12-24 ENCOUNTER — Encounter: Payer: Self-pay | Admitting: Family Medicine

## 2017-12-25 ENCOUNTER — Other Ambulatory Visit: Payer: Self-pay | Admitting: Family Medicine

## 2017-12-25 DIAGNOSIS — F410 Panic disorder [episodic paroxysmal anxiety] without agoraphobia: Secondary | ICD-10-CM

## 2017-12-26 ENCOUNTER — Other Ambulatory Visit: Payer: Self-pay | Admitting: Family Medicine

## 2017-12-26 DIAGNOSIS — F410 Panic disorder [episodic paroxysmal anxiety] without agoraphobia: Secondary | ICD-10-CM

## 2017-12-29 ENCOUNTER — Encounter: Payer: Self-pay | Admitting: Family Medicine

## 2017-12-29 ENCOUNTER — Ambulatory Visit: Payer: BLUE CROSS/BLUE SHIELD | Admitting: Family Medicine

## 2017-12-29 VITALS — BP 120/78 | HR 70 | Temp 98.3°F | Resp 16 | Wt 242.4 lb

## 2017-12-29 DIAGNOSIS — B372 Candidiasis of skin and nail: Secondary | ICD-10-CM | POA: Diagnosis not present

## 2017-12-29 DIAGNOSIS — Z8669 Personal history of other diseases of the nervous system and sense organs: Secondary | ICD-10-CM | POA: Diagnosis not present

## 2017-12-29 DIAGNOSIS — Z833 Family history of diabetes mellitus: Secondary | ICD-10-CM

## 2017-12-29 DIAGNOSIS — Z0130 Encounter for examination of blood pressure without abnormal findings: Secondary | ICD-10-CM

## 2017-12-29 LAB — POCT GLYCOSYLATED HEMOGLOBIN (HGB A1C): HEMOGLOBIN A1C: 5.2 % (ref 4.0–5.6)

## 2017-12-29 NOTE — Patient Instructions (Signed)
Continue miconazole for a week beyond when the rash goes away. Powder daily to keep moisture in check. Try two Aleve or Excedrin Migraine when you see the flashing lights before your headache starts. Also consider updating your eye exam.

## 2017-12-29 NOTE — Progress Notes (Signed)
  Subjective:     Patient ID: Melissa Small, female   DOB: December 11, 1995, 22 y.o.   MRN: [id number] Chief Complaint  Patient presents with  . Advice Only    Patient would like to discuss family history of diabetes, patient states that she has concerns that she has diabetes because of recent episodes of increased thirst, visual changes and decreased appetite. Patient states that she also has concerns of blood pressure.  . Rash    Patient states that she has been applying miconazole cream under breast for fungal infection but states that skin is still raw and itchy.    HPI States she will see "flashing floaters" then develop a frontal pressure headache which will last for an hour or so. Concerned that it may be her blood pressure or diabetes contributing. States she has had one migraine headache in the past with a maternal history of the same. States otc miconazole has nearly resolved the rash underneath her breasts. 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 Review of Systems     Objective:   Physical Exam  Constitutional: She appears well-developed and well-nourished. No distress.  Cardiovascular: Normal rate and regular rhythm.  Pulmonary/Chest: Breath sounds normal.  Musculoskeletal: She exhibits no edema (of lower extremities).  Skin:  Inframammary fading patches of erythema with post inflammatory hyperpigmentation. No scaling or vesicles.       Assessment:    1. Family history of diabetes mellitus - POCT glycosylated hemoglobin (Hb A1C)  2. Candidal intertrigo; continue miconazole  3. Blood pressure check  4. Hx of migraine headaches    Plan:    Discussed use of two Aleve and Excedrin Migraine at onset of headaches. Encouraged updating eye exam.

## 2018-01-09 IMAGING — CR DG KNEE COMPLETE 4+V*R*
1 series · 4 of 4 positions shown · non-contrast
Comparison: None.

CLINICAL DATA: Chronic knee pain for several years, no injury

EXAM:
RIGHT KNEE - COMPLETE 4+ VIEW

[Series 1: dg knee complete 4 views right · 0.14mm/px · 4 of 4 slices shown]
[im 1/4]
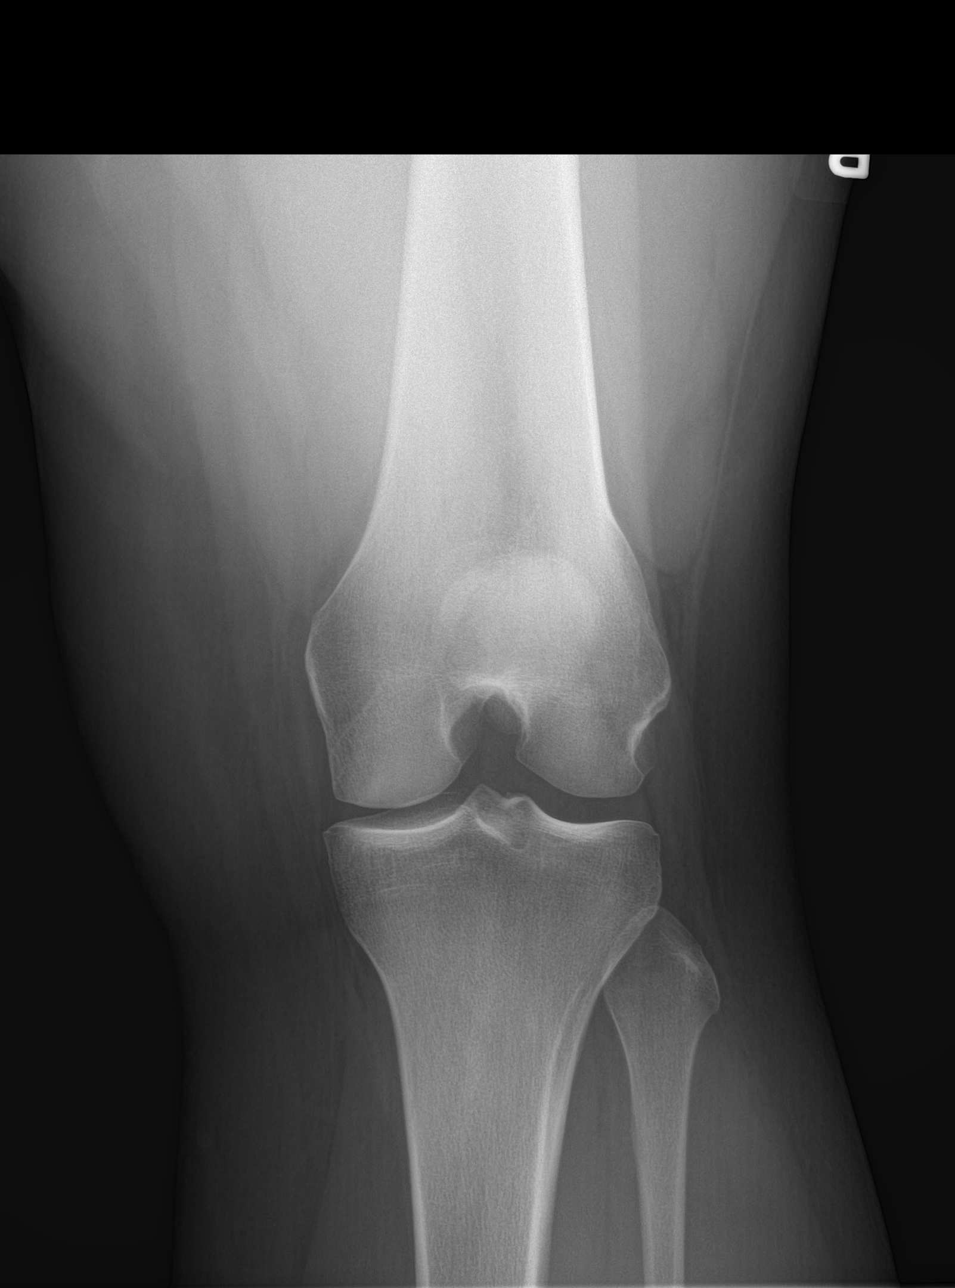
[im 2/4]
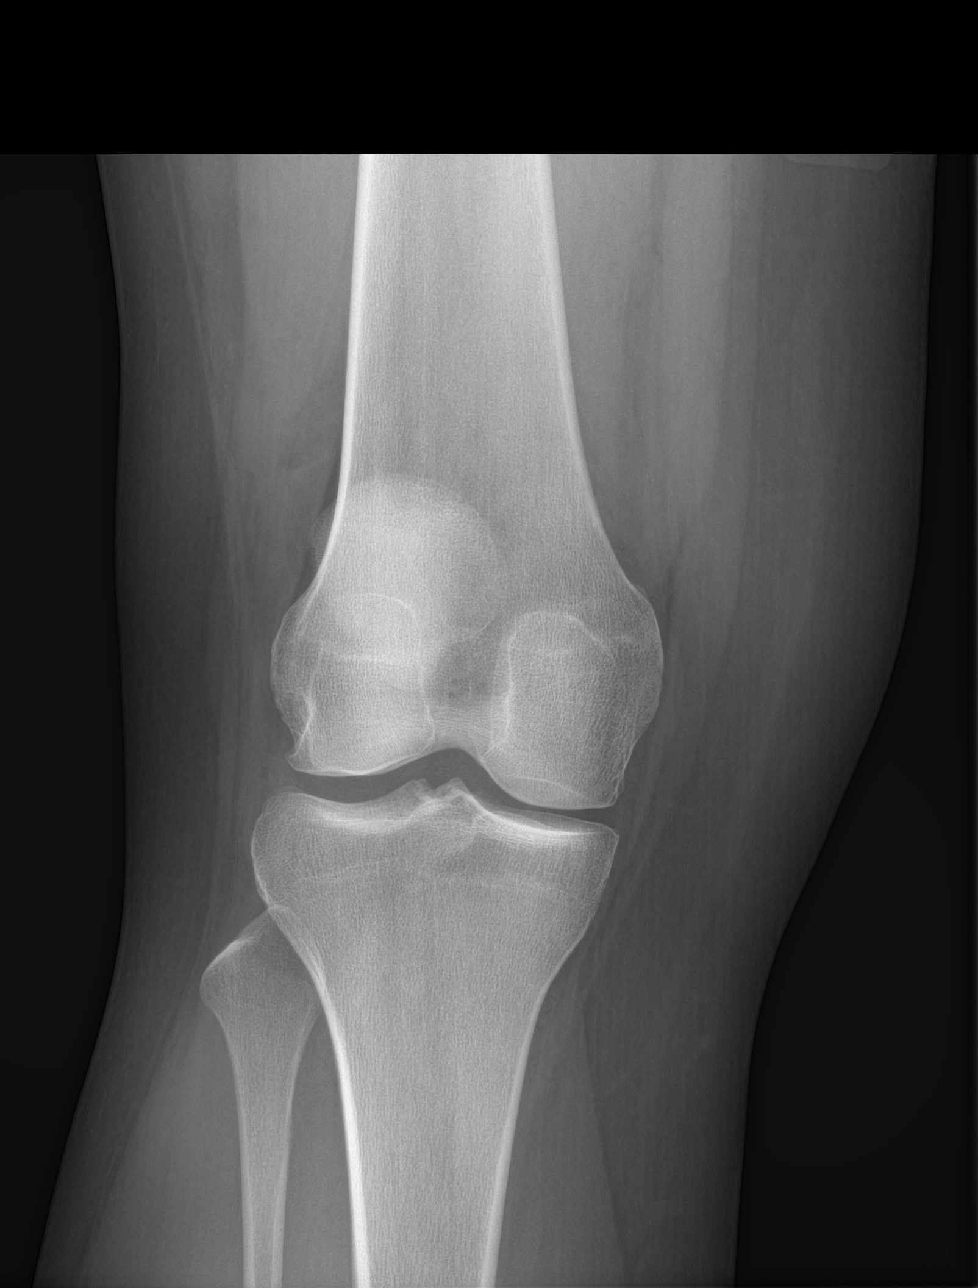
[im 3/4]
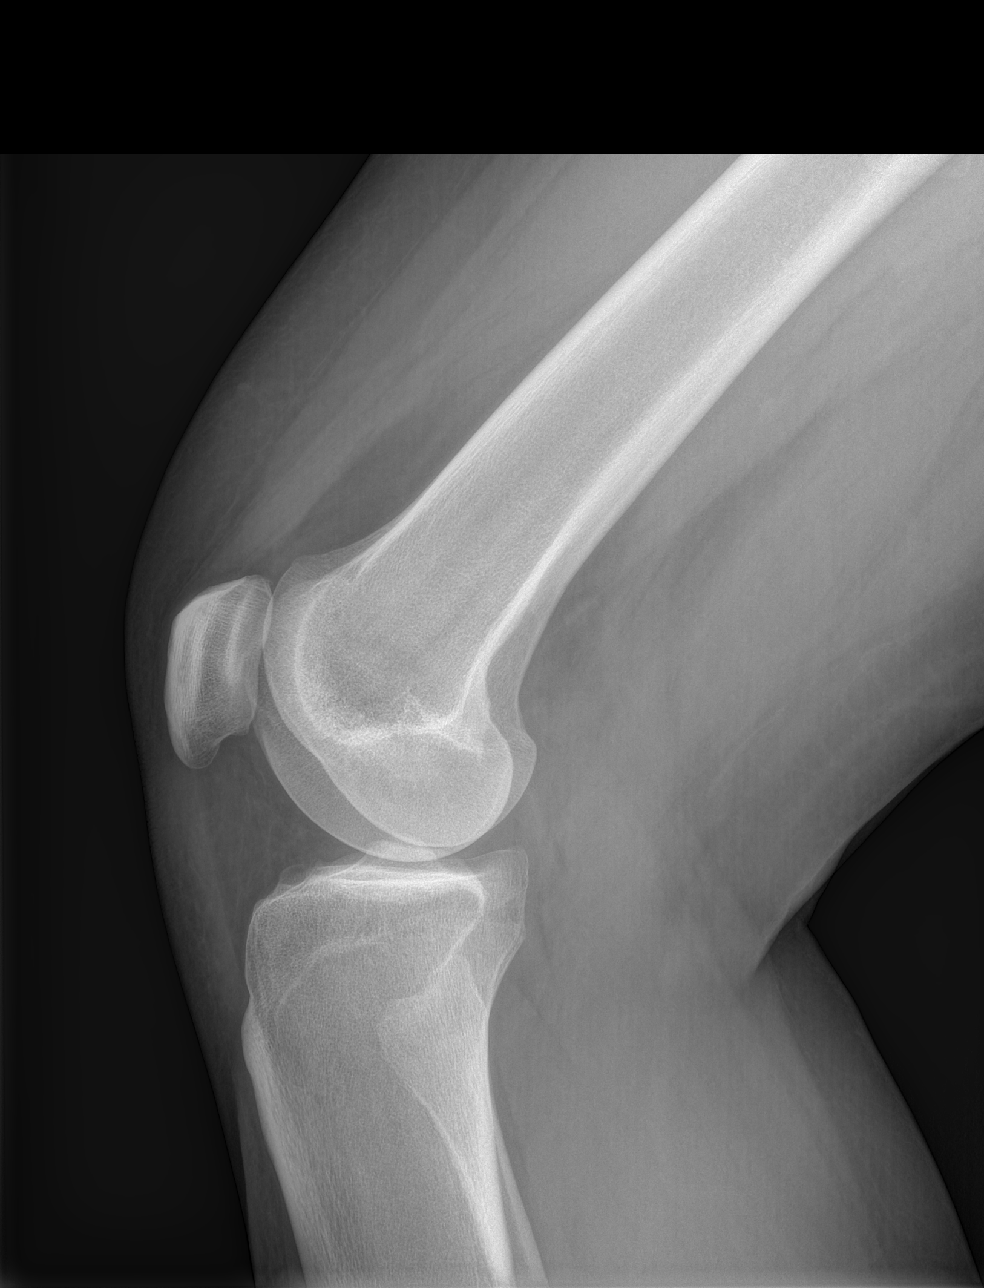
[im 4/4]
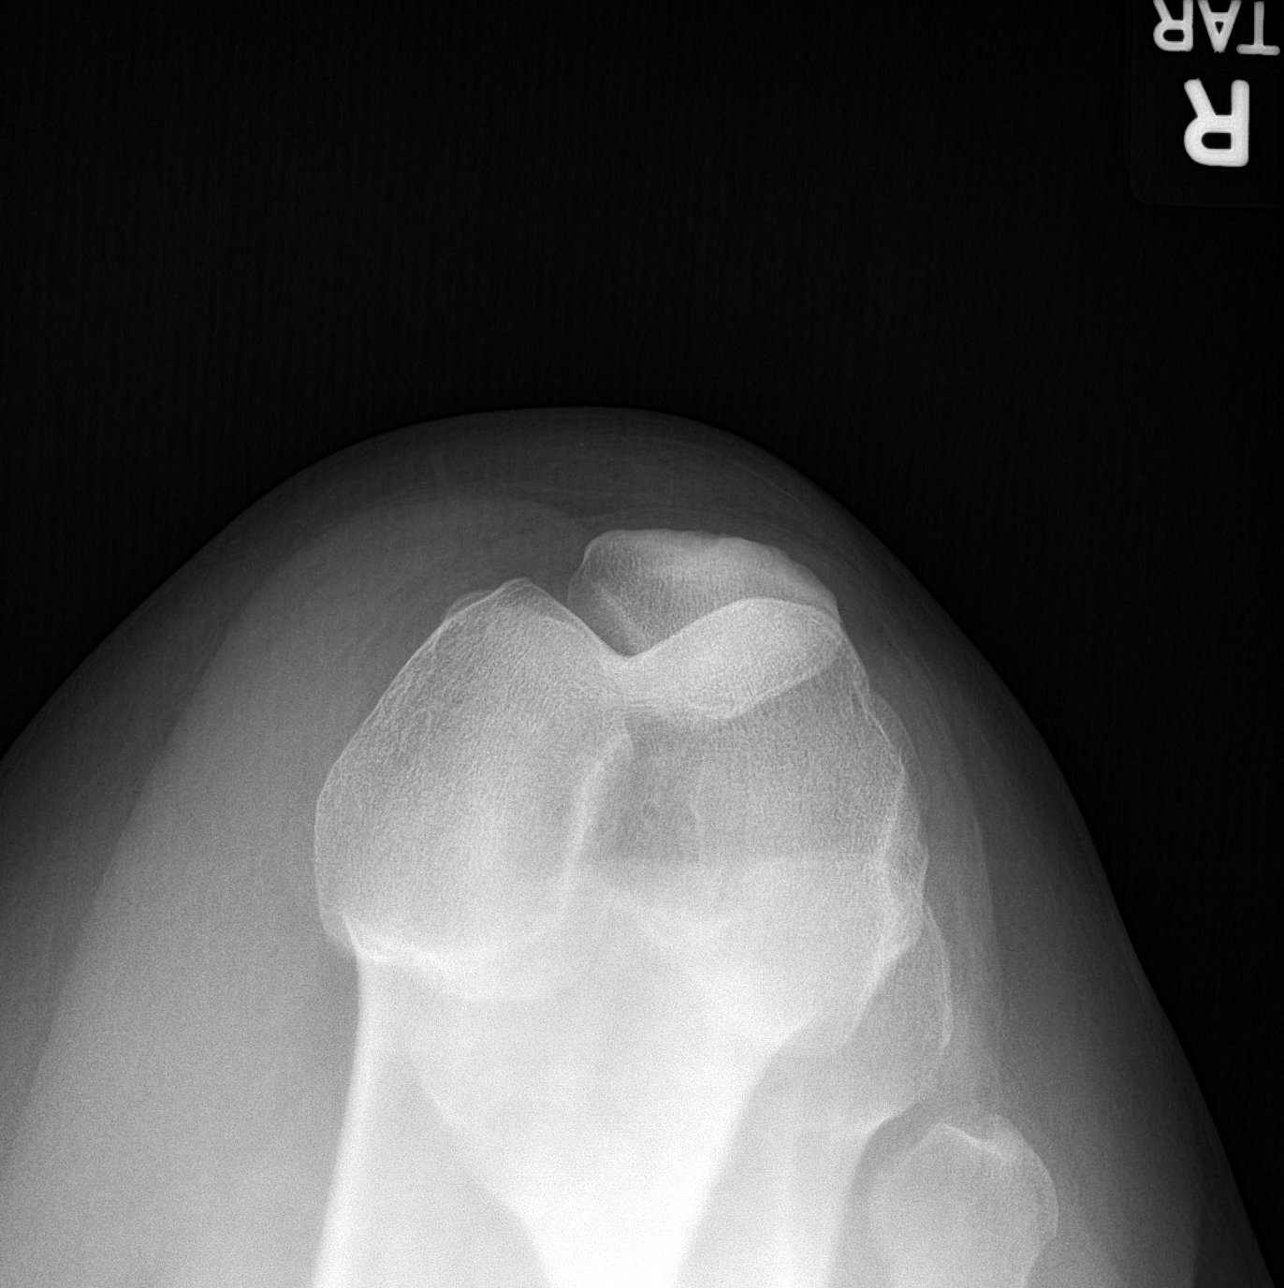

[4 of 4 positions shown; findings below may reference images not displayed]

FINDINGS: The knee joint spaces appear normal. No fracture is seen. No joint
effusion is noted. There may be minimal spurring from the lateral
compartment.
IMPRESSION: No acute abnormality. No effusion. Question minimal spurring from
the lateral compartment.

## 2018-02-07 ENCOUNTER — Other Ambulatory Visit: Payer: Self-pay | Admitting: Family Medicine

## 2018-02-07 DIAGNOSIS — F410 Panic disorder [episodic paroxysmal anxiety] without agoraphobia: Secondary | ICD-10-CM

## 2018-02-11 DIAGNOSIS — Z30013 Encounter for initial prescription of injectable contraceptive: Secondary | ICD-10-CM | POA: Diagnosis not present

## 2018-02-11 DIAGNOSIS — Z3009 Encounter for other general counseling and advice on contraception: Secondary | ICD-10-CM | POA: Diagnosis not present

## 2018-03-15 ENCOUNTER — Other Ambulatory Visit: Payer: Self-pay | Admitting: Family Medicine

## 2018-03-15 DIAGNOSIS — F410 Panic disorder [episodic paroxysmal anxiety] without agoraphobia: Secondary | ICD-10-CM

## 2018-04-17 ENCOUNTER — Other Ambulatory Visit: Payer: Self-pay | Admitting: Family Medicine

## 2018-04-17 DIAGNOSIS — F410 Panic disorder [episodic paroxysmal anxiety] without agoraphobia: Secondary | ICD-10-CM

## 2018-05-17 DIAGNOSIS — Z23 Encounter for immunization: Secondary | ICD-10-CM | POA: Diagnosis not present

## 2018-05-17 DIAGNOSIS — Z30013 Encounter for initial prescription of injectable contraceptive: Secondary | ICD-10-CM | POA: Diagnosis not present

## 2018-05-17 DIAGNOSIS — Z915 Personal history of self-harm: Secondary | ICD-10-CM | POA: Insufficient documentation

## 2018-05-17 DIAGNOSIS — Z3009 Encounter for other general counseling and advice on contraception: Secondary | ICD-10-CM | POA: Diagnosis not present

## 2018-05-17 DIAGNOSIS — IMO0002 Reserved for concepts with insufficient information to code with codable children: Secondary | ICD-10-CM | POA: Insufficient documentation

## 2018-05-17 LAB — HM HIV SCREENING LAB: HM HIV Screening: NEGATIVE

## 2018-06-01 ENCOUNTER — Encounter: Payer: Self-pay | Admitting: Family Medicine

## 2018-06-04 ENCOUNTER — Ambulatory Visit (INDEPENDENT_AMBULATORY_CARE_PROVIDER_SITE_OTHER): Payer: BLUE CROSS/BLUE SHIELD | Admitting: Family Medicine

## 2018-06-04 ENCOUNTER — Ambulatory Visit: Payer: BLUE CROSS/BLUE SHIELD | Admitting: Family Medicine

## 2018-06-04 ENCOUNTER — Other Ambulatory Visit: Payer: Self-pay

## 2018-06-04 ENCOUNTER — Encounter: Payer: Self-pay | Admitting: Family Medicine

## 2018-06-04 VITALS — BP 168/100 | HR 106 | Temp 98.1°F | Ht 69.0 in | Wt 245.6 lb

## 2018-06-04 DIAGNOSIS — N3091 Cystitis, unspecified with hematuria: Secondary | ICD-10-CM | POA: Diagnosis not present

## 2018-06-04 LAB — POCT URINALYSIS DIPSTICK
Bilirubin, UA: NEGATIVE
GLUCOSE UA: NEGATIVE
Ketones, UA: NEGATIVE
NITRITE UA: NEGATIVE
PROTEIN UA: NEGATIVE
Urobilinogen, UA: 0.2 E.U./dL
pH, UA: 7.5 (ref 5.0–8.0)

## 2018-06-04 MED ORDER — CEPHALEXIN 500 MG PO CAPS: 500.0000 mg | ORAL_CAPSULE | Freq: Two times a day (BID) | ORAL | 0 refills | Status: DC

## 2018-06-04 NOTE — Progress Notes (Signed)
  Subjective:     Patient ID: Melissa Small, female   DOB: 1995-05-06, 23 y.o.   MRN: [id number] Chief Complaint  Patient presents with  . Urinary Tract Infection    possible UTI.  urgency but trickles out and burning.  since 05/26/18   HPI Reports feeling hot but no fever documented.  Review of Systems     Objective:   Physical Exam Constitutional:      General: She is not in acute distress.    Appearance: She is not ill-appearing.  Genitourinary:    Comments: No cva tenderness Neurological:     Mental Status: She is alert.        Assessment:    1. Cystitis with hematuria: start cephalexin - POCT Urinalysis Dipstick - Urine Culture    Plan:    Increase fluids with further f/u pending urine culture results

## 2018-06-04 NOTE — Patient Instructions (Signed)
We will call you with the urine culture results. Increase fluid intake . Come in for a nurse bp check when better.

## 2018-06-06 LAB — URINE CULTURE

## 2018-06-07 ENCOUNTER — Telehealth: Payer: Self-pay | Admitting: Family Medicine

## 2018-06-07 ENCOUNTER — Encounter: Payer: Self-pay | Admitting: Family Medicine

## 2018-06-07 ENCOUNTER — Other Ambulatory Visit: Payer: Self-pay | Admitting: Family Medicine

## 2018-06-07 NOTE — Telephone Encounter (Signed)
Returned pt's call and advised lab results.  dbs

## 2018-06-07 NOTE — Telephone Encounter (Signed)
Pt returning missed call. ° °Please call pt back. ° °Thanks, °TGH °

## 2018-06-11 ENCOUNTER — Other Ambulatory Visit: Payer: Self-pay | Admitting: Family Medicine

## 2018-06-11 MED ORDER — CEPHALEXIN 500 MG PO CAPS: 500.0000 mg | ORAL_CAPSULE | Freq: Two times a day (BID) | ORAL | 0 refills | Status: DC

## 2018-06-27 ENCOUNTER — Other Ambulatory Visit: Payer: Self-pay | Admitting: Family Medicine

## 2018-06-27 DIAGNOSIS — F410 Panic disorder [episodic paroxysmal anxiety] without agoraphobia: Secondary | ICD-10-CM

## 2018-06-28 ENCOUNTER — Other Ambulatory Visit: Payer: Self-pay | Admitting: Family Medicine

## 2018-08-02 DIAGNOSIS — Z30013 Encounter for initial prescription of injectable contraceptive: Secondary | ICD-10-CM | POA: Diagnosis not present

## 2018-08-02 DIAGNOSIS — Z3009 Encounter for other general counseling and advice on contraception: Secondary | ICD-10-CM | POA: Diagnosis not present

## 2018-08-19 ENCOUNTER — Encounter: Payer: Self-pay | Admitting: Family Medicine

## 2018-08-19 ENCOUNTER — Other Ambulatory Visit: Payer: Self-pay

## 2018-08-19 ENCOUNTER — Ambulatory Visit (INDEPENDENT_AMBULATORY_CARE_PROVIDER_SITE_OTHER): Payer: BLUE CROSS/BLUE SHIELD | Admitting: Family Medicine

## 2018-08-19 VITALS — Temp 100.8°F

## 2018-08-19 DIAGNOSIS — J029 Acute pharyngitis, unspecified: Secondary | ICD-10-CM | POA: Diagnosis not present

## 2018-08-19 MED ORDER — AMOXICILLIN 875 MG PO TABS: 875.0000 mg | ORAL_TABLET | Freq: Two times a day (BID) | ORAL | 0 refills | Status: DC

## 2018-08-19 NOTE — Progress Notes (Signed)
Patient: Melissa Small Female    DOB: 11/02/1995   22 y.o.   MRN: [id number] Visit Date: 08/19/2018  Today's Provider: Dortha Kern, PA   Chief Complaint  Patient presents with  . Sore Throat   Subjective:     Virtual Visit via Video Note  I connected with Melissa Small on 08/19/18 at  9:40 AM EDT by a video enabled telemedicine application and verified that I am speaking with the correct person using two identifiers.   Sore Throat   This is a new problem. Episode onset: 3 days ago. Maximum temperature: up to 100.8. Associated symptoms include coughing, a hoarse voice and shortness of breath. Pertinent negatives include no abdominal pain or vomiting.   Past Surgical History:  Procedure Laterality Date  . NO PAST SURGERIES     Past Medical History:  Diagnosis Date  . Anxiety    Allergies  Allergen Reactions  . Meloxicam Nausea Only    Current Outpatient Medications:  .  clonazePAM (KLONOPIN) 0.5 MG tablet, TAKE 1 TABLET BY MOUTH TWICE DAILY AS NEEDED, Disp: 60 tablet, Rfl: 1 .  medroxyPROGESTERone (DEPO-PROVERA) 150 MG/ML injection, Inject 150 mg into the muscle every 3 (three) months., Disp: , Rfl:   Review of Systems  Constitutional: Positive for chills, diaphoresis and fever. Negative for appetite change and fatigue.  HENT: Positive for hoarse voice and sore throat.   Respiratory: Positive for cough and shortness of breath. Negative for chest tightness.   Cardiovascular: Negative for chest pain and palpitations.  Gastrointestinal: Negative for abdominal pain, nausea and vomiting.  Neurological: Negative for dizziness and weakness.    Social History   Tobacco Use  . Smoking status: Current Every Day Smoker    Types: E-cigarettes  . Smokeless tobacco: Never Used  Substance Use Topics  . Alcohol use: No    Alcohol/week: 0.0 standard drinks     Objective:   Temp (!) 100.8 F (38.2 C) (Axillary)  Vitals:   08/19/18 0931  Temp: (!)  100.8 F (38.2 C)  TempSrc: Axillary   Physical Exam WDWN female in no apparent distress.  Head: Normocephalic, atraumatic. Neck: Supple, NROM Respiratory: No apparent distress Psych: Normal mood and affect Throat: No exudates visible. Mild erythema. Voice sounds as if she has hot food in her mouth.    Assessment & Plan    1. Sore throat Developed AM and PM sore throat 2-3 days ago. Has progressed to a swollen sensation with fever of 100.8 (axillary). Some headache, but no visible respiratory distress. Suspect tonsillitis and will treat with Amoxil and Chloroseptic spray. Should drink extra fluids and may Tylenol or Advil prn fever. Stay home and take all the antibiotics until free of fever for 72 hours without antipyretic. Note written for her to be out of work until seen 08-23-18. Mother Ripley Fraise) to come by the office to pick up the note. - amoxicillin (AMOXIL) 875 MG tablet; Take 1 tablet (875 mg total) by mouth 2 (two) times daily.  Dispense: 20 tablet; Refill: 0   I discussed the assessment and treatment plan with the patient. The patient was provided an opportunity to ask questions and all were answered. The patient agreed with the plan and demonstrated an understanding of the instructions.   The patient was advised to call back or seek an in-person evaluation if the symptoms worsen or if the condition fails to improve as anticipated.  I provided 15 minutes of non-face-to-face time during  this encounter.    Dortha Kernennis Danine Hor, PA  [facility]

## 2018-08-24 ENCOUNTER — Encounter: Payer: Self-pay | Admitting: Family Medicine

## 2018-09-20 ENCOUNTER — Encounter: Payer: Self-pay | Admitting: Family Medicine

## 2018-09-21 ENCOUNTER — Other Ambulatory Visit: Payer: Self-pay | Admitting: Family Medicine

## 2018-09-21 DIAGNOSIS — F410 Panic disorder [episodic paroxysmal anxiety] without agoraphobia: Secondary | ICD-10-CM

## 2018-09-21 MED ORDER — CLONAZEPAM 0.5 MG PO TABS: 0.5000 mg | ORAL_TABLET | Freq: Two times a day (BID) | ORAL | 1 refills | Status: DC | PRN

## 2018-09-21 NOTE — Telephone Encounter (Signed)
[facility] faxed refill request for the following medications:  clonazePAM (KLONOPIN) 0.5 MG tablet   Please advise. 

## 2018-10-25 DIAGNOSIS — Z30013 Encounter for initial prescription of injectable contraceptive: Secondary | ICD-10-CM | POA: Diagnosis not present

## 2018-10-25 DIAGNOSIS — Z3009 Encounter for other general counseling and advice on contraception: Secondary | ICD-10-CM | POA: Diagnosis not present

## 2018-12-11 ENCOUNTER — Other Ambulatory Visit: Payer: Self-pay | Admitting: Family Medicine

## 2018-12-11 DIAGNOSIS — F410 Panic disorder [episodic paroxysmal anxiety] without agoraphobia: Secondary | ICD-10-CM

## 2019-01-08 ENCOUNTER — Encounter: Payer: Self-pay | Admitting: Family Medicine

## 2019-01-15 ENCOUNTER — Other Ambulatory Visit: Payer: Self-pay | Admitting: Family Medicine

## 2019-01-15 DIAGNOSIS — F410 Panic disorder [episodic paroxysmal anxiety] without agoraphobia: Secondary | ICD-10-CM

## 2019-01-17 DIAGNOSIS — E669 Obesity, unspecified: Secondary | ICD-10-CM

## 2019-01-17 DIAGNOSIS — Z915 Personal history of self-harm: Secondary | ICD-10-CM

## 2019-01-17 DIAGNOSIS — IMO0002 Reserved for concepts with insufficient information to code with codable children: Secondary | ICD-10-CM

## 2019-01-17 DIAGNOSIS — Z6834 Body mass index (BMI) 34.0-34.9, adult: Secondary | ICD-10-CM

## 2019-01-17 DIAGNOSIS — R030 Elevated blood-pressure reading, without diagnosis of hypertension: Secondary | ICD-10-CM

## 2019-01-18 ENCOUNTER — Other Ambulatory Visit: Payer: Self-pay

## 2019-01-18 ENCOUNTER — Ambulatory Visit (LOCAL_COMMUNITY_HEALTH_CENTER): Payer: BC Managed Care – PPO

## 2019-01-18 VITALS — BP 122/76 | Ht 69.0 in | Wt 241.0 lb

## 2019-01-18 DIAGNOSIS — Z3009 Encounter for other general counseling and advice on contraception: Secondary | ICD-10-CM

## 2019-01-18 DIAGNOSIS — Z30013 Encounter for initial prescription of injectable contraceptive: Secondary | ICD-10-CM | POA: Diagnosis not present

## 2019-01-18 MED ORDER — MEDROXYPROGESTERONE ACETATE 150 MG/ML IM SUSP
150.0000 mg | Freq: Once | INTRAMUSCULAR | Status: AC
  Administered 2019-01-18: 150 mg via INTRAMUSCULAR

## 2019-01-18 NOTE — Progress Notes (Signed)
Last physical at [facility] 05/17/2018 (1 year depo order by Donnal Moat, CNM). Last depo at [facility] 10/25/2018; 12.1 weeks post depo. DMPA 150 mg IM per Donnal Moat, CNM order dated 05/17/2018.

## 2019-02-14 ENCOUNTER — Telehealth: Payer: Self-pay | Admitting: Family Medicine

## 2019-02-14 NOTE — Telephone Encounter (Signed)
Patients Depo is due in December. Patient is thinking of getting an IUD instead. Wants to talk to nurse about device and when will she be able to get it.

## 2019-02-15 NOTE — Telephone Encounter (Signed)
This RN called pt at phone # provided. Pt reports that she is currently on Depo but is interested in the Bedias IUD. Pt's questions were answered and completed pt counseling on IUD from IUD consult. IUD consult completed. Pt states she would like to go ahead and schedule to come in for IUD insertion. So Mirena IUD insertion scheduled for 03/01/2019 at 1:20pm per pt request. Pt aware of appt date and time and to arrive early for check-in. Pt with no other questions or concerns at this time.Ronny Bacon, RN

## 2019-02-20 ENCOUNTER — Other Ambulatory Visit: Payer: Self-pay | Admitting: Family Medicine

## 2019-02-20 ENCOUNTER — Encounter: Payer: Self-pay | Admitting: Family Medicine

## 2019-02-20 DIAGNOSIS — F410 Panic disorder [episodic paroxysmal anxiety] without agoraphobia: Secondary | ICD-10-CM

## 2019-02-21 MED ORDER — CLONAZEPAM 0.5 MG PO TABS: 0.5000 mg | ORAL_TABLET | Freq: Two times a day (BID) | ORAL | 0 refills | Status: DC | PRN

## 2019-03-01 ENCOUNTER — Encounter: Payer: Self-pay | Admitting: Family Medicine

## 2019-03-01 ENCOUNTER — Ambulatory Visit (INDEPENDENT_AMBULATORY_CARE_PROVIDER_SITE_OTHER): Payer: BC Managed Care – PPO | Admitting: Family Medicine

## 2019-03-01 ENCOUNTER — Other Ambulatory Visit: Payer: Self-pay

## 2019-03-01 ENCOUNTER — Ambulatory Visit (LOCAL_COMMUNITY_HEALTH_CENTER): Payer: BC Managed Care – PPO | Admitting: Family Medicine

## 2019-03-01 VITALS — BP 128/82 | HR 100 | Temp 97.3°F | Wt 250.0 lb

## 2019-03-01 VITALS — BP 145/87 | Ht 70.0 in | Wt 249.0 lb

## 2019-03-01 DIAGNOSIS — Z8669 Personal history of other diseases of the nervous system and sense organs: Secondary | ICD-10-CM | POA: Diagnosis not present

## 2019-03-01 DIAGNOSIS — Z113 Encounter for screening for infections with a predominantly sexual mode of transmission: Secondary | ICD-10-CM

## 2019-03-01 DIAGNOSIS — Z30430 Encounter for insertion of intrauterine contraceptive device: Secondary | ICD-10-CM | POA: Diagnosis not present

## 2019-03-01 DIAGNOSIS — Z3009 Encounter for other general counseling and advice on contraception: Secondary | ICD-10-CM | POA: Diagnosis not present

## 2019-03-01 DIAGNOSIS — F410 Panic disorder [episodic paroxysmal anxiety] without agoraphobia: Secondary | ICD-10-CM | POA: Diagnosis not present

## 2019-03-01 MED ORDER — LEVONORGESTREL 20 MCG/24HR IU IUD
1.0000 | INTRAUTERINE_SYSTEM | Freq: Once | INTRAUTERINE | Status: AC
  Administered 2019-03-01: 1 via INTRAUTERINE

## 2019-03-01 NOTE — Progress Notes (Signed)
Melissa Small  MRN: [id number] DOB: 12/25/1995  Subjective:  HPI   The patient is a 23 year old female who presents for follow up and discussion of migraine headaches.  She has been on Exedrine Migraine for these and states she has to take them about 3 times a day when having the headaches.  She would like to discuss the possibility of other options. She is also here to obtain refills on her other medications.  Patient Active Problem List   Diagnosis Date Noted   H/O self-harm 05/17/2018   Hx of migraine headaches 12/29/2017   Panic attacks 12/17/2015   Chronic knee pain 11/28/2015   Obesity 01/11/2015   Elevated blood pressure reading without diagnosis of hypertension 12/19/2013   Past Medical History:  Diagnosis Date   Anxiety    Past Surgical History:  Procedure Laterality Date   NO PAST SURGERIES     Family History  Problem Relation Age of Onset   Hypertension Mother    Arthritis Mother    Migraines Mother    Clotting disorder Mother        blood clots in leg   Arthritis Father    Arthritis Maternal Grandmother    Hypertension Maternal Grandmother    Clotting disorder Maternal Grandmother        blood clots in leg and lung   Stroke Maternal Grandfather    Asthma Paternal Grandmother    Social History   Socioeconomic History   Marital status: Single    Spouse name: Not on file   Number of children: Not on file   Years of education: Not on file   Highest education level: Not on file  Occupational History   Not on file  Social Needs   Financial resource strain: Not on file   Food insecurity    Worry: Not on file    Inability: Not on file   Transportation needs    Medical: Not on file    Non-medical: Not on file  Tobacco Use   Smoking status: Current Every Day Smoker    Types: E-cigarettes   Smokeless tobacco: Never Used  Substance and Sexual Activity   Alcohol use: No    Alcohol/week: 0.0 standard drinks   Drug  use: No   Sexual activity: Yes    Partners: Male    Birth control/protection: Injection  Lifestyle   Physical activity    Days per week: Not on file    Minutes per session: Not on file   Stress: Not on file  Relationships   Social connections    Talks on phone: Not on file    Gets together: Not on file    Attends religious service: Not on file    Active member of club or organization: Not on file    Attends meetings of clubs or organizations: Not on file    Relationship status: Not on file   Intimate partner violence    Fear of current or ex partner: Not on file    Emotionally abused: Not on file    Physically abused: Not on file    Forced sexual activity: Not on file  Other Topics Concern   Not on file  Social History Narrative   Not on file   Outpatient Encounter Medications as of 03/01/2019  Medication Sig   amoxicillin (AMOXIL) 875 MG tablet Take 1 tablet (875 mg total) by mouth 2 (two) times daily. (Patient not taking: Reported on 01/18/2019)   aspirin-acetaminophen-caffeine (EXCEDRIN  MIGRAINE) 250-250-65 MG tablet Take 2 tablets by mouth every 6 (six) hours as needed for headache.   clonazePAM (KLONOPIN) 0.5 MG tablet Take 1 tablet (0.5 mg total) by mouth 2 (two) times daily as needed.   medroxyPROGESTERone (DEPO-PROVERA) 150 MG/ML injection Inject 150 mg into the muscle every 3 (three) months.   No facility-administered encounter medications on file as of 03/01/2019.    Allergies  Allergen Reactions   Meloxicam Nausea Only   Review of Systems  Constitutional: Negative for chills, diaphoresis, fever and malaise/fatigue.  HENT: Negative for ear pain, sinus pain and sore throat.   Respiratory: Negative for cough and shortness of breath.   Cardiovascular: Negative for chest pain.  Neurological: Negative for headaches.    Objective:  BP 128/82 (BP Location: Right Arm)    Pulse 100    Temp (!) 97.3 F (36.3 C)    Wt 250 lb (113.4 kg)    SpO2 99%    BMI  36.92 kg/m   Physical Exam  Constitutional: She is oriented to person, place, and time and well-developed, well-nourished, and in no distress.  HENT:  Head: Normocephalic.  Mouth/Throat: Oropharynx is clear and moist.  Very large stretched holes in ear lobes. No hearing loss.  Eyes: Conjunctivae are normal.  Neck: Neck supple.  Cardiovascular: Normal rate.  Pulmonary/Chest: Effort normal and breath sounds normal.  Abdominal: Soft. Bowel sounds are normal.  Musculoskeletal: Normal range of motion.  Neurological: She is alert and oriented to person, place, and time.  Skin: No rash noted.  Psychiatric: Affect and judgment normal. Her mood appears anxious.    Assessment and Plan :  1. Panic attacks Has been on Clonazepam 0.5 mg BID for the past 3 years. Anxiety with panic attacks present as chest tightness, slight wheeze, abdominal cramps, crying spells and panic that last 1/2 to 1 1/2 hours most days. Feels the Clonazepam helps control these episodes. Seems to have a good sleep pattern (gets an average of 8.5 hrs a night). Some anxiety triggered by stressful work at an [employer] (life [employer] center. No suicidal ideation. Family history positive for some similar anxiety with depression in father. Will check routine labs. Probably should consider an SSRI to prevent panic attacks and save Clonazepam for flares. This may help to control migraines. - CBC with Differential/Platelet - Comprehensive metabolic panel - TSH  2. Hx of migraine headaches Excedrin Migraine helps to relieve headaches and sometimes has to take them TID. Stopped the Depo-Provera and will be getting an IUD today. Feels hormones may have been a trigger for migraines. Recheck routine labs and follow up pending report. An SSRI for anxiety control may help with migraine control, also. - CBC with Differential/Platelet - Comprehensive metabolic panel - TSH

## 2019-03-01 NOTE — Progress Notes (Signed)
Pt here for Mirena IUD insertion and desires STD screening as well. Pt currently on Depo. Last Depo was 01/18/2019, so pt is 6 weeks post last Depo. Pt has been consistently on time for Depo so no UPT needed today per Hassell Done, FNP verbal order. Pt reports migraines with Depo and doesn't want to be on depo for long term. Pt without symptoms today but desires STD screening. Pt also desires blood work for HIV and syphillis. BP recheck 138/79. RN counseling completed for Mirena insertion, consents signed and questions answered.Ronny Bacon, RN

## 2019-03-01 NOTE — Progress Notes (Signed)
    Patient presented to [facility] for IUD insertion. Her GC/CT screening was found to be up to date and using WHO criteria we can be reasonably certain she is not pregnant.  See Flowsheet for IUD check list  IUD Insertion Procedure Note Patient identified, informed consent performed, consent signed.   Discussed risks of irregular bleeding, cramping, infection, malpositioning or misplacement of the IUD outside the uterus which may require further procedure such as laparoscopy. Time out was performed.    Speculum placed in the vagina.  Cervix visualized.  Cleaned with Betadine x 2.  Grasped anteriorly with a single tooth tenaculum.  Uterus sounded to 8 cm.  IUD placed per manufacturer's recommendations.  Strings trimmed to 3 cm. Tenaculum was removed, good hemostasis noted.  Patient tolerated procedure well.   Patient was given post-procedure instructions- both agency handout and verbally by provider.  She was advised to have backup contraception for one week.  Patient was also asked to check IUD strings periodically or follow up in 4 weeks for IUD check.

## 2019-03-02 LAB — COMPREHENSIVE METABOLIC PANEL
ALT: 33 IU/L — ABNORMAL HIGH (ref 0–32)
AST: 26 IU/L (ref 0–40)
Albumin/Globulin Ratio: 1.3 (ref 1.2–2.2)
Albumin: 4.3 g/dL (ref 3.9–5.0)
Alkaline Phosphatase: 105 IU/L (ref 39–117)
BUN/Creatinine Ratio: 13 (ref 9–23)
BUN: 11 mg/dL (ref 6–20)
Bilirubin Total: 0.3 mg/dL (ref 0.0–1.2)
CO2: 21 mmol/L (ref 20–29)
Calcium: 9.5 mg/dL (ref 8.7–10.2)
Chloride: 103 mmol/L (ref 96–106)
Creatinine, Ser: 0.85 mg/dL (ref 0.57–1.00)
GFR calc Af Amer: 111 mL/min/{1.73_m2} (ref >59)
GFR calc non Af Amer: 96 mL/min/{1.73_m2} (ref >59)
Globulin, Total: 3.2 g/dL (ref 1.5–4.5)
Glucose: 81 mg/dL (ref 65–99)
Potassium: 4.5 mmol/L (ref 3.5–5.2)
Sodium: 138 mmol/L (ref 134–144)
Total Protein: 7.5 g/dL (ref 6.0–8.5)

## 2019-03-02 LAB — CBC WITH DIFFERENTIAL/PLATELET
Basophils Absolute: 0.1 10*3/uL (ref 0.0–0.2)
Basos: 1 %
EOS (ABSOLUTE): 0.1 10*3/uL (ref 0.0–0.4)
Eos: 1 %
Hematocrit: 44.2 % (ref 34.0–46.6)
Hemoglobin: 14.8 g/dL (ref 11.1–15.9)
Immature Grans (Abs): 0 10*3/uL (ref 0.0–0.1)
Immature Granulocytes: 0 %
Lymphocytes Absolute: 2.2 10*3/uL (ref 0.7–3.1)
Lymphs: 28 %
MCH: 30.6 pg (ref 26.6–33.0)
MCHC: 33.5 g/dL (ref 31.5–35.7)
MCV: 92 fL (ref 79–97)
Monocytes Absolute: 0.5 10*3/uL (ref 0.1–0.9)
Monocytes: 7 %
Neutrophils Absolute: 4.9 10*3/uL (ref 1.4–7.0)
Neutrophils: 63 %
Platelets: 296 10*3/uL (ref 150–450)
RBC: 4.83 x10E6/uL (ref 3.77–5.28)
RDW: 12.9 % (ref 11.7–15.4)
WBC: 7.7 10*3/uL (ref 3.4–10.8)

## 2019-03-02 LAB — TSH: TSH: 1.73 u[IU]/mL (ref 0.450–4.500)

## 2019-03-04 ENCOUNTER — Telehealth: Payer: Self-pay

## 2019-03-04 ENCOUNTER — Encounter: Payer: Self-pay | Admitting: Family Medicine

## 2019-03-04 NOTE — Telephone Encounter (Signed)
LMTCB

## 2019-03-04 NOTE — Telephone Encounter (Signed)
-----   Message from Margo Common, [place] sent at 03/03/2019  6:16 PM EDT ----- Labs essentially normal. Recommend Sertraline 50 mg hs #30 & 3 refills. Use this regularly to help maintain control of anxiety/panic. Recheck progress in 2 months to see if dosage needs any adjustments up.

## 2019-03-07 ENCOUNTER — Other Ambulatory Visit: Payer: Self-pay | Admitting: Family Medicine

## 2019-03-07 DIAGNOSIS — F410 Panic disorder [episodic paroxysmal anxiety] without agoraphobia: Secondary | ICD-10-CM

## 2019-03-07 LAB — IGP, RFX APTIMA HPV ASCU: PAP Smear Comment: 0

## 2019-03-07 MED ORDER — SERTRALINE HCL 50 MG PO TABS: 50.0000 mg | ORAL_TABLET | Freq: Every day | ORAL | 3 refills | Status: DC

## 2019-03-07 NOTE — Progress Notes (Signed)
Will send Sertraline to the [facility] for baseline control of anxiety/panic attacks. Recheck in 2 months to see if dosage adjustments needed.

## 2019-03-11 NOTE — Telephone Encounter (Signed)
LMTCB ED 

## 2019-03-11 NOTE — Telephone Encounter (Signed)
Patient returned call and stated she had already been advised through [facility] by Simona Huh.

## 2019-04-02 ENCOUNTER — Encounter: Payer: Self-pay | Admitting: Family Medicine

## 2019-04-22 ENCOUNTER — Encounter: Payer: Self-pay | Admitting: Family Medicine

## 2019-07-08 ENCOUNTER — Other Ambulatory Visit: Payer: Self-pay | Admitting: Family Medicine

## 2019-07-08 DIAGNOSIS — F410 Panic disorder [episodic paroxysmal anxiety] without agoraphobia: Secondary | ICD-10-CM

## 2019-07-08 NOTE — Telephone Encounter (Signed)
Requested Prescriptions  Pending Prescriptions Disp Refills  . sertraline (ZOLOFT) 50 MG tablet [Pharmacy Med Name: Sertraline HCl 50 MG Oral Tablet] 90 tablet 0    Sig: Take 1 tablet by mouth once daily     Psychiatry:  Antidepressants - SSRI Passed - 07/08/2019  9:57 PM      Passed - Valid encounter within last 6 months    Recent Outpatient Visits          4 months ago Panic attacks   [facility] [place], [place], [place]   10 months ago Sore throat   [facility] [place], [place], [place]   1 year ago Cystitis with hematuria   [facility] [place], [place], [place]   1 year ago Family history of diabetes mellitus   [facility] [place], [place], [place]   2 years ago Blood pressure check   [facility] [place], [place]

## 2019-07-30 ENCOUNTER — Encounter: Payer: Self-pay | Admitting: Family Medicine

## 2019-10-15 ENCOUNTER — Other Ambulatory Visit: Payer: Self-pay | Admitting: Family Medicine

## 2019-10-15 DIAGNOSIS — F410 Panic disorder [episodic paroxysmal anxiety] without agoraphobia: Secondary | ICD-10-CM

## 2019-10-15 NOTE — Telephone Encounter (Signed)
Requested medications are due for refill today?  Yes  Requested medications are on active medication list?  Yes  Last Refill:   07/08/2019  # 90 with no refills - with a notation indicating patient needed to schedule an office visit.    Future visit scheduled?  No   Notes to Clinic:  Medication failed RX refill protocol due to no valid encounter in the past 6 months.

## 2019-10-18 ENCOUNTER — Encounter: Payer: Self-pay | Admitting: Family Medicine

## 2019-10-18 DIAGNOSIS — F410 Panic disorder [episodic paroxysmal anxiety] without agoraphobia: Secondary | ICD-10-CM

## 2019-10-19 MED ORDER — SERTRALINE HCL 50 MG PO TABS: 50.0000 mg | ORAL_TABLET | Freq: Every day | ORAL | 0 refills | Status: DC

## 2019-10-19 NOTE — Addendum Note (Signed)
Addended by: Lannie Fields on: 10/19/2019 09:06 AM   Modules accepted: Orders

## 2019-10-19 NOTE — Telephone Encounter (Signed)
Pt has made follow up appt for 10/31/19.  Pt states that is first day she has off work that she can come in.  Would like to know if you could send 30 day Rx sertraline (ZOLOFT) 50 MG tablet to get her through until this appt?  Pt has only 1 pill left.  [facility] [address]), [place] - [address] Phone:  [phone]  Fax:  [phone]

## 2019-10-20 ENCOUNTER — Other Ambulatory Visit: Payer: Self-pay | Admitting: Family Medicine

## 2019-10-20 DIAGNOSIS — F410 Panic disorder [episodic paroxysmal anxiety] without agoraphobia: Secondary | ICD-10-CM

## 2019-10-20 MED ORDER — SERTRALINE HCL 50 MG PO TABS: 50.0000 mg | ORAL_TABLET | Freq: Every day | ORAL | 3 refills | Status: DC

## 2019-10-31 ENCOUNTER — Ambulatory Visit (INDEPENDENT_AMBULATORY_CARE_PROVIDER_SITE_OTHER): Payer: BC Managed Care – PPO | Admitting: Family Medicine

## 2019-10-31 ENCOUNTER — Other Ambulatory Visit: Payer: Self-pay

## 2019-10-31 ENCOUNTER — Encounter: Payer: Self-pay | Admitting: Family Medicine

## 2019-10-31 VITALS — BP 122/80 | HR 72 | Temp 97.5°F | Ht 70.0 in | Wt 220.0 lb

## 2019-10-31 DIAGNOSIS — F410 Panic disorder [episodic paroxysmal anxiety] without agoraphobia: Secondary | ICD-10-CM

## 2019-10-31 MED ORDER — SERTRALINE HCL 50 MG PO TABS: 50.0000 mg | ORAL_TABLET | Freq: Every day | ORAL | 2 refills | Status: DC

## 2019-10-31 NOTE — Progress Notes (Signed)
Established patient visit   Patient: Melissa Small   DOB: 1996/05/03   24 y.o. Female  MRN: [id number] Visit Date: 10/31/2019  Today's healthcare provider: Vernie Murders, PA   Chief Complaint  Patient presents with  . Anxiety   Subjective     GAD 7 : Generalized Anxiety Score 10/31/2019  Nervous, Anxious, on Edge 1  Control/stop worrying 0  Worry too much - different things 1  Trouble relaxing 0  Restless 0  Easily annoyed or irritable 3  Afraid - awful might happen 0  Total GAD 7 Score 5  Anxiety Difficulty Somewhat difficult   Having some irritability with customer stress but better than last year. Good appetite and sleeping 8 hours each night she takes the Zoloft. No longer taking any Klonopin.   Patient Active Problem List   Diagnosis Date Noted  . H/O self-harm 05/17/2018  . Hx of migraine headaches 12/29/2017  . Panic attacks 12/17/2015  . Chronic knee pain 11/28/2015  . Obesity 01/11/2015  . Elevated blood pressure reading without diagnosis of hypertension 12/19/2013   Past Medical History:  Diagnosis Date  . Anxiety    Past Surgical History:  Procedure Laterality Date  . NO PAST SURGERIES     Family History  Problem Relation Age of Onset  . Hypertension Mother   . Arthritis Mother   . Migraines Mother   . Clotting disorder Mother        blood clots in leg  . Arthritis Father   . Arthritis Maternal Grandmother   . Hypertension Maternal Grandmother   . Clotting disorder Maternal Grandmother        blood clots in leg and lung  . Stroke Maternal Grandfather   . Asthma Paternal Grandmother    Social History   Tobacco Use  . Smoking status: Current Every Day Smoker    Types: E-cigarettes  . Smokeless tobacco: Never Used  Substance Use Topics  . Alcohol use: No    Alcohol/week: 0.0 standard drinks  . Drug use: No   Allergies  Allergen Reactions  . Meloxicam Nausea Only    Medications: Outpatient Medications Prior to Visit   Medication Sig  . aspirin-acetaminophen-caffeine (EXCEDRIN MIGRAINE) 250-250-65 MG tablet Take 2 tablets by mouth every 6 (six) hours as needed for headache.  . sertraline (ZOLOFT) 50 MG tablet Take 1 tablet by mouth once daily  . clonazePAM (KLONOPIN) 0.5 MG tablet Take 1 tablet (0.5 mg total) by mouth 2 (two) times daily as needed.   No facility-administered medications prior to visit.    Review of Systems  Constitutional: Positive for irritability. Negative for activity change, appetite change, chills, diaphoresis, fatigue, fever and unexpected weight change.  Respiratory: Negative.  Negative for shortness of breath.   Cardiovascular: Negative.  Negative for chest pain.  Gastrointestinal: Negative.   Neurological: Negative for dizziness, light-headedness and headaches.  Psychiatric/Behavioral: Negative.  Negative for decreased concentration. The patient is not nervous/anxious and does not have insomnia.     Objective    BP 122/80 (BP Location: Right Arm, Patient Position: Sitting, Cuff Size: Large)   Pulse 72   Temp (!) 97.5 F (36.4 C) (Temporal)   Ht 5\' 10"  (1.778 m)   Wt 220 lb (99.8 kg)   SpO2 99%   BMI 31.57 kg/m  BP Readings from Last 3 Encounters:  10/31/19 122/80  03/01/19 (!) 145/87  03/01/19 128/82   Wt Readings from Last 3 Encounters:  10/31/19 220 lb (99.8 kg)  03/01/19 249 lb (112.9 kg)  03/01/19 250 lb (113.4 kg)   Physical Exam Constitutional:      General: She is not in acute distress.    Appearance: She is well-developed.  HENT:     Head: Normocephalic and atraumatic.     Right Ear: Hearing normal.     Left Ear: Hearing normal.     Nose: Nose normal.  Eyes:     General: Lids are normal. No scleral icterus.       Right eye: No discharge.        Left eye: No discharge.     Conjunctiva/sclera: Conjunctivae normal.  Cardiovascular:     Rate and Rhythm: Normal rate and regular rhythm.     Heart sounds: Normal heart sounds.  Pulmonary:      Effort: Pulmonary effort is normal. No respiratory distress.  Musculoskeletal:        General: Normal range of motion.     Cervical back: Neck supple.  Skin:    Findings: No lesion or rash.  Neurological:     Mental Status: She is alert and oriented to person, place, and time.  Psychiatric:        Speech: Speech normal.        Behavior: Behavior normal.        Thought Content: Thought content normal.      No results found for any visits on 10/31/19.  Assessment & Plan     1. Panic attacks Feeling well with no significant panic attacks since starting the Zoloft qd. No depression or suicidal ideation. Recheck CBC, CMP and TSH. Follow up pending reports. - sertraline (ZOLOFT) 50 MG tablet; Take 1 tablet (50 mg total) by mouth daily.  Dispense: 90 tablet; Refill: 2 - CBC with Differential/Platelet - Comprehensive metabolic panel - TSH   No follow-ups on file.         Dortha Kern, PA  [facility] [phone] (phone) [phone] (fax)  [facility]

## 2019-10-31 NOTE — Patient Instructions (Signed)
.   Please review the attached list of medications and notify my office if there are any errors.   . Please bring all of your medications to every appointment so we can make sure that our medication list is the same as yours.   

## 2019-11-01 DIAGNOSIS — F410 Panic disorder [episodic paroxysmal anxiety] without agoraphobia: Secondary | ICD-10-CM | POA: Diagnosis not present

## 2019-11-02 LAB — COMPREHENSIVE METABOLIC PANEL
ALT: 18 IU/L (ref 0–32)
AST: 18 IU/L (ref 0–40)
Albumin/Globulin Ratio: 1.7 (ref 1.2–2.2)
Albumin: 4.5 g/dL (ref 3.9–5.0)
Alkaline Phosphatase: 88 IU/L (ref 48–121)
BUN/Creatinine Ratio: 19 (ref 9–23)
BUN: 13 mg/dL (ref 6–20)
Bilirubin Total: 0.4 mg/dL (ref 0.0–1.2)
CO2: 21 mmol/L (ref 20–29)
Calcium: 9.3 mg/dL (ref 8.7–10.2)
Chloride: 103 mmol/L (ref 96–106)
Creatinine, Ser: 0.68 mg/dL (ref 0.57–1.00)
GFR calc Af Amer: 141 mL/min/{1.73_m2} (ref >59)
GFR calc non Af Amer: 122 mL/min/{1.73_m2} (ref >59)
Globulin, Total: 2.6 g/dL (ref 1.5–4.5)
Glucose: 79 mg/dL (ref 65–99)
Potassium: 4.4 mmol/L (ref 3.5–5.2)
Sodium: 139 mmol/L (ref 134–144)
Total Protein: 7.1 g/dL (ref 6.0–8.5)

## 2019-11-02 LAB — CBC WITH DIFFERENTIAL/PLATELET
Basophils Absolute: 0 10*3/uL (ref 0.0–0.2)
Basos: 1 %
EOS (ABSOLUTE): 0.1 10*3/uL (ref 0.0–0.4)
Eos: 2 %
Hematocrit: 42.6 % (ref 34.0–46.6)
Hemoglobin: 14.6 g/dL (ref 11.1–15.9)
Immature Grans (Abs): 0 10*3/uL (ref 0.0–0.1)
Immature Granulocytes: 0 %
Lymphocytes Absolute: 1.7 10*3/uL (ref 0.7–3.1)
Lymphs: 34 %
MCH: 31.2 pg (ref 26.6–33.0)
MCHC: 34.3 g/dL (ref 31.5–35.7)
MCV: 91 fL (ref 79–97)
Monocytes Absolute: 0.4 10*3/uL (ref 0.1–0.9)
Monocytes: 8 %
Neutrophils Absolute: 2.7 10*3/uL (ref 1.4–7.0)
Neutrophils: 55 %
Platelets: 238 10*3/uL (ref 150–450)
RBC: 4.68 x10E6/uL (ref 3.77–5.28)
RDW: 12.5 % (ref 11.7–15.4)
WBC: 5 10*3/uL (ref 3.4–10.8)

## 2019-11-02 LAB — TSH: TSH: 2.12 u[IU]/mL (ref 0.450–4.500)

## 2020-01-24 ENCOUNTER — Ambulatory Visit (LOCAL_COMMUNITY_HEALTH_CENTER): Payer: BC Managed Care – PPO | Admitting: Physician Assistant

## 2020-01-24 ENCOUNTER — Other Ambulatory Visit: Payer: Self-pay

## 2020-01-24 ENCOUNTER — Encounter: Payer: Self-pay | Admitting: Physician Assistant

## 2020-01-24 VITALS — BP 126/82 | Ht 69.0 in | Wt 205.0 lb

## 2020-01-24 DIAGNOSIS — Z3009 Encounter for other general counseling and advice on contraception: Secondary | ICD-10-CM

## 2020-01-24 DIAGNOSIS — Z30432 Encounter for removal of intrauterine contraceptive device: Secondary | ICD-10-CM | POA: Diagnosis not present

## 2020-01-24 NOTE — Progress Notes (Signed)
Pt is here for IUD removal. Pt reports that she is not sexually active and that she gets cramping in her uterus on and off that is really bad when here period comes on with the IUD. Pt reports is not interested in birth control at this time. Pt aware that if she changes her mind and desires BCM later on to give [place] a call and RTC and pt states understanding.

## 2020-01-27 NOTE — Progress Notes (Signed)
[facility] problem [facility]  [facility]  Subjective:  Melissa Small is a 24 y.o. being seen today for IUD removal.  Chief Complaint  Patient presents with  . Contraception    IUD removal    HPI  Patient states that she would like her IUD removed due to cramping.  States that she is also having irregular bleeding.  Reports that she plans to use condoms if she is sexually active.  Denies other concerns today.    Does the patient have a current or past history of drug use? No   No components found for: HCV]   Health Maintenance Due  Topic Date Due  . Hepatitis C Screening  Never done  . TETANUS/TDAP  Never done  . INFLUENZA VACCINE  12/04/2019    Review of Systems  All other systems reviewed and are negative.   The following portions of the patient's history were reviewed and updated as appropriate: allergies, current medications, past family history, past medical history, past social history, past surgical history and problem list. Problem list updated.   See flowsheet for other program required questions.  Objective:   Vitals:   01/24/20 1032  BP: 126/82  Weight: 205 lb (93 kg)  Height: 5\' 9"  (1.753 m)    Physical Exam Vitals and nursing note reviewed.  Constitutional:      General: She is not in acute distress.    Appearance: Normal appearance.  HENT:     Head: Normocephalic and atraumatic.  Eyes:     Conjunctiva/sclera: Conjunctivae normal.  Pulmonary:     Effort: Pulmonary effort is normal.  Abdominal:     Palpations: Abdomen is soft. There is no mass.     Tenderness: There is no abdominal tenderness. There is no guarding or rebound.  Genitourinary:    General: Normal vulva.     Rectum: Normal.     Comments: External genitalia/pubic area without nits, lice, edema, erythema, lesions and inguinal adenopathy. Vagina with normal mucosa and discharge. Cervix without visible lesions.  IUD strings not  visualized.  Skin:    General: Skin is warm and dry.  Neurological:     Mental Status: She is alert and oriented to person, place, and time.  Psychiatric:        Mood and Affect: Mood normal.        Behavior: Behavior normal.        Thought Content: Thought content normal.        Judgment: Judgment normal.       Assessment and Plan:  Melissa Small is a 24 y.o. female presenting to the [facility] Department for a Women's Health problem visit  1. Encounter for counseling regarding contraception Counseled that she can use condoms and OTC spermicides as her [facility] for better effectiveness. RTC if decides that she wants to use a different hormonal BCM.  2. Encounter for IUD removal   IUD Removal  Patient identified, informed consent performed, consent signed.  Patient was in the dorsal lithotomy position, normal external genitalia was noted.  A speculum was placed in the patient's vagina, normal discharge was noted, no lesions. The cervix was visualized, no lesions, no abnormal discharge.   The strings of the IUD were not visualized, cytobrush was attempted which was unsuccessful so Kelly forceps were introduced into the endometrial cavity and the IUD was grasped and removed in its entirety.  Patient tolerated the procedure well.    Patient will  use condoms for contraception.  Routine preventative health maintenance measures emphasized.      Return for annual and PRN.  No future appointments.  Matt Holmes, PA

## 2020-04-14 DIAGNOSIS — Z20822 Contact with and (suspected) exposure to covid-19: Secondary | ICD-10-CM | POA: Diagnosis not present

## 2020-06-08 DIAGNOSIS — Z20822 Contact with and (suspected) exposure to covid-19: Secondary | ICD-10-CM | POA: Diagnosis not present

## 2020-06-08 DIAGNOSIS — Z03818 Encounter for observation for suspected exposure to other biological agents ruled out: Secondary | ICD-10-CM | POA: Diagnosis not present

## 2020-06-29 ENCOUNTER — Encounter: Payer: Self-pay | Admitting: Family Medicine

## 2020-07-02 ENCOUNTER — Ambulatory Visit: Payer: Self-pay | Admitting: Family Medicine

## 2020-07-02 NOTE — Telephone Encounter (Signed)
Pt reports "Lightheaded when bending over."  Onset 2 weeks ago.  Denies spinning, feels "Floaty." States needs to rest a minute, then resolves.Does occur daily but not each time has to bend. States one occasion when she felt lightheaded sitting to standing. Denies sinus issues or congestion,no earache, is staying hydrated. No new medications. BP 119/76 HR 88. Does reports H/O migraines, last one last week. Reports intentional weight loss, "Maybe I need blood work or something."  Agent had made appt for Wedn AM prior to triage. Care advise given per protocol, advised ED if symptoms worsen. Pt verbalizes understanding.   Reason for Disposition . [1] MILD dizziness (e.g., walking normally) AND [2] has NOT been evaluated by physician for this  (Exception: dizziness caused by heat exposure, sudden standing, or poor fluid intake)  Answer Assessment - Initial Assessment Questions 1. DESCRIPTION: "Describe your dizziness."     With bending down, lightheaded 2. LIGHTHEADED: "Do you feel lightheaded?" (e.g., somewhat faint, woozy, weak upon standing)     Yes 3. VERTIGO: "Do you feel like either you or the room is spinning or tilting?" (i.e. vertigo)     No "Floaty" 4. SEVERITY: "How bad is it?"  "Do you feel like you are going to faint?" "Can you stand and walk?"   - MILD: Feels slightly dizzy, but walking normally.   - MODERATE: Feels very unsteady when walking, but not falling; interferes with normal activities (e.g., school, work) .   - SEVERE: Unable to walk without falling, or requires assistance to walk without falling; feels like passing out now.      Mild-moderate 5. ONSET:  "When did the dizziness begin?"     2 weeks ago, "Not as frequent"  6. AGGRAVATING FACTORS: "Does anything make it worse?" (e.g., standing, change in head position)     Bending over, infrequently with sitting to standing 7. HEART RATE: "Can you tell me your heart rate?" "How many beats in 15 seconds?"  (Note: not all patients  can do this)       no 8. CAUSE: "What do you think is causing the dizziness?"     Unsure 9. RECURRENT SYMPTOM: "Have you had dizziness before?" If Yes, ask: "When was the last time?" "What happened that time?"     No 10. OTHER SYMPTOMS: "Do you have any other symptoms?" (e.g., fever, chest pain, vomiting, diarrhea, bleeding)       Had migraine 3 days ago, lasted 45 minutes. 11. PREGNANCY: "Is there any chance you are pregnant?" "When was your last menstrual period?"     NO  Protocols used: DIZZINESS [facility]

## 2020-07-02 NOTE — Telephone Encounter (Signed)
Fyi. kw 

## 2020-07-04 ENCOUNTER — Ambulatory Visit: Payer: BC Managed Care – PPO | Admitting: Physician Assistant

## 2020-07-04 ENCOUNTER — Other Ambulatory Visit: Payer: Self-pay

## 2020-07-04 ENCOUNTER — Encounter: Payer: Self-pay | Admitting: Physician Assistant

## 2020-07-04 ENCOUNTER — Ambulatory Visit: Payer: BC Managed Care – PPO | Admitting: Adult Health

## 2020-07-04 VITALS — BP 122/81 | HR 97 | Temp 99.2°F | Wt 182.0 lb

## 2020-07-04 DIAGNOSIS — I951 Orthostatic hypotension: Secondary | ICD-10-CM

## 2020-07-04 NOTE — Patient Instructions (Signed)

## 2020-07-04 NOTE — Progress Notes (Signed)
Established patient visit   Patient: Melissa Small   DOB: 06-Jun-1995   25 y.o. Female  MRN: [id number] Visit Date: 07/04/2020  Today's healthcare provider: Margaretann Loveless, PA-C   Chief Complaint  Patient presents with  . Dizziness   Subjective    Dizziness This is a new problem. The current episode started 1 to 4 weeks ago. The problem occurs daily. The problem has been waxing and waning (Especially going from crouching to standing position ). Pertinent negatives include no anorexia, congestion, fatigue, headaches, nausea or sore throat. The symptoms are aggravated by bending.     Patient Active Problem List   Diagnosis Date Noted  . H/O self-harm 05/17/2018  . Hx of migraine headaches 12/29/2017  . Panic attacks 12/17/2015  . Chronic knee pain 11/28/2015  . Obesity 01/11/2015  . Elevated blood pressure reading without diagnosis of hypertension 12/19/2013   Past Medical History:  Diagnosis Date  . Anxiety    Social History   Tobacco Use  . Smoking status: Former Smoker    Types: E-cigarettes  . Smokeless tobacco: Never Used  Substance Use Topics  . Alcohol use: No    Alcohol/week: 0.0 standard drinks  . Drug use: No   Allergies  Allergen Reactions  . Meloxicam Nausea Only     Medications: Outpatient Medications Prior to Visit  Medication Sig  . sertraline (ZOLOFT) 50 MG tablet Take 1 tablet (50 mg total) by mouth daily.  Marland Kitchen aspirin-acetaminophen-caffeine (EXCEDRIN MIGRAINE) 250-250-65 MG tablet Take 2 tablets by mouth every 6 (six) hours as needed for headache. (Patient not taking: Reported on 07/04/2020)   No facility-administered medications prior to visit.    Review of Systems  Constitutional: Negative.  Negative for fatigue.  HENT: Positive for tinnitus. Negative for congestion, ear discharge, ear pain, nosebleeds, postnasal drip, rhinorrhea, sinus pressure, sinus pain and sore throat.   Respiratory: Negative.   Cardiovascular: Negative.    Gastrointestinal: Negative.  Negative for anorexia and nausea.  Genitourinary: Positive for flank pain.  Neurological: Positive for dizziness. Negative for light-headedness and headaches.  Hematological: Does not bruise/bleed easily.     Objective    BP 122/81 (BP Location: Right Arm, Patient Position: Sitting, Cuff Size: Large)   Pulse 97   Temp 99.2 F (37.3 C) (Oral)   Wt 182 lb (82.6 kg)   BMI 26.88 kg/m  Orthostatic VS for the past 72 hrs (Last 3 readings):  Orthostatic BP Patient Position BP Location Cuff Size Orthostatic Pulse  07/04/20 1509 118/82 Standing Right Arm Large 101  07/04/20 1508 120/80 Sitting Right Arm Large 96  07/04/20 1507 121/76 Supine Right Arm Large 84     Physical Exam Vitals reviewed.  Constitutional:      General: She is not in acute distress.    Appearance: Normal appearance. She is well-developed, well-groomed, overweight and well-nourished. She is not diaphoretic.  Neck:     Thyroid: No thyromegaly.     Vascular: No carotid bruit or JVD.     Trachea: No tracheal deviation.  Cardiovascular:     Rate and Rhythm: Normal rate and regular rhythm.     Pulses: Normal pulses.     Heart sounds: Normal heart sounds. No murmur heard. No friction rub. No gallop.   Pulmonary:     Effort: Pulmonary effort is normal. No respiratory distress.     Breath sounds: Normal breath sounds. No wheezing or rales.  Musculoskeletal:     Cervical back: Normal range  of motion and neck supple. No tenderness.  Lymphadenopathy:     Cervical: No cervical adenopathy.  Skin:    General: Skin is warm and dry.  Neurological:     Mental Status: She is alert.  Psychiatric:        Behavior: Behavior is cooperative.      No results found for any visits on 07/04/20.  Assessment & Plan     1. Orthostatic hypotension Suspect orthostatic hypotension. Will check labs as below and f/u pending results for other sources of fluid loss such as anemia or hypoglycemic  episodes. [name] has lost from around 250 pounds in 2020 to now 182 pounds. [name] and may be having dehydration symptoms. Advised to push fluids, including water and electrolyte drinks. Call if worsening.  - CBC w/Diff/Platelet - Comprehensive Metabolic Panel (CMET) - HgB A1c - TSH   No follow-ups on file.      Delmer Islam, PA-C, have reviewed all documentation for this visit. The documentation on 07/04/20 for the exam, diagnosis, procedures, and orders are all accurate and complete.   Reine Just  [facility] [phone] (phone) [phone] (fax)  [facility]

## 2020-07-05 LAB — COMPREHENSIVE METABOLIC PANEL
ALT: 28 IU/L (ref 0–32)
AST: 29 IU/L (ref 0–40)
Albumin/Globulin Ratio: 1.6 (ref 1.2–2.2)
Albumin: 4.4 g/dL (ref 3.9–5.0)
Alkaline Phosphatase: 67 IU/L (ref 44–121)
BUN/Creatinine Ratio: 16 (ref 9–23)
BUN: 13 mg/dL (ref 6–20)
Bilirubin Total: 0.2 mg/dL (ref 0.0–1.2)
CO2: 22 mmol/L (ref 20–29)
Calcium: 9.7 mg/dL (ref 8.7–10.2)
Chloride: 102 mmol/L (ref 96–106)
Creatinine, Ser: 0.83 mg/dL (ref 0.57–1.00)
Globulin, Total: 2.7 g/dL (ref 1.5–4.5)
Glucose: 89 mg/dL (ref 65–99)
Potassium: 4.2 mmol/L (ref 3.5–5.2)
Sodium: 137 mmol/L (ref 134–144)
Total Protein: 7.1 g/dL (ref 6.0–8.5)
eGFR: 100 mL/min/{1.73_m2} (ref >59)

## 2020-07-05 LAB — CBC WITH DIFFERENTIAL/PLATELET
Basophils Absolute: 0 10*3/uL (ref 0.0–0.2)
Basos: 0 %
EOS (ABSOLUTE): 0 10*3/uL (ref 0.0–0.4)
Eos: 0 %
Hematocrit: 40.5 % (ref 34.0–46.6)
Hemoglobin: 13.7 g/dL (ref 11.1–15.9)
Immature Grans (Abs): 0 10*3/uL (ref 0.0–0.1)
Immature Granulocytes: 0 %
Lymphocytes Absolute: 1.4 10*3/uL (ref 0.7–3.1)
Lymphs: 19 %
MCH: 31.9 pg (ref 26.6–33.0)
MCHC: 33.8 g/dL (ref 31.5–35.7)
MCV: 94 fL (ref 79–97)
Monocytes Absolute: 0.5 10*3/uL (ref 0.1–0.9)
Monocytes: 6 %
Neutrophils Absolute: 5.8 10*3/uL (ref 1.4–7.0)
Neutrophils: 75 %
Platelets: 273 10*3/uL (ref 150–450)
RBC: 4.29 x10E6/uL (ref 3.77–5.28)
RDW: 12.1 % (ref 11.7–15.4)
WBC: 7.8 10*3/uL (ref 3.4–10.8)

## 2020-07-05 LAB — HEMOGLOBIN A1C
Est. average glucose Bld gHb Est-mCnc: 91 mg/dL
Hgb A1c MFr Bld: 4.8 % (ref 4.8–5.6)

## 2020-07-05 LAB — TSH: TSH: 1.4 u[IU]/mL (ref 0.450–4.500)

## 2020-07-09 NOTE — Progress Notes (Signed)
CBC, CMP, and TSH for thyroid are all within normal limits.

## 2020-10-30 ENCOUNTER — Other Ambulatory Visit: Payer: Self-pay | Admitting: Family Medicine

## 2020-10-30 ENCOUNTER — Encounter: Payer: Self-pay | Admitting: Family Medicine

## 2020-10-30 DIAGNOSIS — F410 Panic disorder [episodic paroxysmal anxiety] without agoraphobia: Secondary | ICD-10-CM

## 2020-11-25 ENCOUNTER — Encounter: Payer: Self-pay | Admitting: Family Medicine

## 2020-11-26 ENCOUNTER — Other Ambulatory Visit: Payer: Self-pay | Admitting: Family Medicine

## 2020-11-26 DIAGNOSIS — F410 Panic disorder [episodic paroxysmal anxiety] without agoraphobia: Secondary | ICD-10-CM

## 2020-11-26 MED ORDER — SERTRALINE HCL 50 MG PO TABS: 50.0000 mg | ORAL_TABLET | Freq: Every day | ORAL | 0 refills | Status: DC

## 2020-12-11 ENCOUNTER — Other Ambulatory Visit: Payer: Self-pay

## 2020-12-11 ENCOUNTER — Encounter: Payer: Self-pay | Admitting: Family Medicine

## 2020-12-11 ENCOUNTER — Ambulatory Visit (INDEPENDENT_AMBULATORY_CARE_PROVIDER_SITE_OTHER): Payer: BC Managed Care – PPO | Admitting: Family Medicine

## 2020-12-11 VITALS — BP 128/86 | HR 77 | Wt 183.0 lb

## 2020-12-11 DIAGNOSIS — F439 Reaction to severe stress, unspecified: Secondary | ICD-10-CM

## 2020-12-11 DIAGNOSIS — F410 Panic disorder [episodic paroxysmal anxiety] without agoraphobia: Secondary | ICD-10-CM

## 2020-12-11 MED ORDER — SERTRALINE HCL 100 MG PO TABS: 100.0000 mg | ORAL_TABLET | Freq: Every day | ORAL | 3 refills | Status: DC

## 2020-12-11 NOTE — Progress Notes (Signed)
Established patient visit   Patient: Melissa Small   DOB: Jun 18, 1995   25 y.o. Female  MRN: [id number] Visit Date: 12/11/2020  Today's healthcare provider: Dortha Kern, PA-C   No chief complaint on file.  Subjective    HPI  Anxiety, Follow-up  She was last seen for anxiety 2 months ago. Changes made at last visit include none.   She reports good compliance with treatment. She reports good tolerance of treatment. She is not having side effects.   She feels her anxiety is moderate and Worse since last visit. Patient states she is under increased stress and feels the medication is not working as well.  Would like to have increase in dosing.  Symptoms: No chest pain Yes difficulty concentrating  No dizziness Yes fatigue  No feelings of losing control Yes insomnia  Yes irritable No palpitations  Yes panic attacks Yes racing thoughts  No shortness of breath No sweating  No tremors/shakes    GAD-7 Results GAD-7 Generalized Anxiety Disorder Screening Tool 10/31/2019  1. Feeling Nervous, Anxious, or on Edge 1  2. Not Being Able to Stop or Control Worrying 0  3. Worrying Too Much About Different Things 1  4. Trouble Relaxing 0  5. Being So Restless it's Hard To Sit Still 0  6. Becoming Easily Annoyed or Irritable 3  7. Feeling Afraid As If Something Awful Might Happen 0  Total GAD-7 Score 5  Difficulty At Work, Home, or Getting  Along With Others? Somewhat difficult    PHQ-9 Scores PHQ9 SCORE ONLY 07/04/2020 06/04/2018 02/19/2017  PHQ-9 Total Score 1 2 4     ---------------------------------------------------------------------------------------------------   Past Medical History:  Diagnosis Date   Anxiety    Past Surgical History:  Procedure Laterality Date   NO PAST SURGERIES     Social History   Tobacco Use   Smoking status: Former    Types: E-cigarettes   Smokeless tobacco: Never  Substance Use Topics   Alcohol use: No    Alcohol/week: 0.0  standard drinks   Drug use: No   Family Status  Relation Name Status   Mother  Alive   Father  Alive   MGM  Alive   MGF  Deceased   PGM  Alive   PGF  Other   Allergies  Allergen Reactions   Meloxicam Nausea Only   Medications: Outpatient Medications Prior to Visit  Medication Sig   aspirin-acetaminophen-caffeine (EXCEDRIN MIGRAINE) 250-250-65 MG tablet Take 2 tablets by mouth every 6 (six) hours as needed for headache.   sertraline (ZOLOFT) 50 MG tablet Take 1 tablet (50 mg total) by mouth daily.   No facility-administered medications prior to visit.    Review of Systems  Skin:  Positive for rash (mostly on torso, has history of excema).  Psychiatric/Behavioral:  Positive for agitation, dysphoric mood and sleep disturbance. Negative for confusion, decreased concentration, hallucinations, self-injury and suicidal ideas. The patient is nervous/anxious. The patient is not hyperactive.    Last CBC Lab Results  Component Value Date   WBC 7.8 07/04/2020   HGB 13.7 07/04/2020   HCT 40.5 07/04/2020   MCV 94 07/04/2020   MCH 31.9 07/04/2020   RDW 12.1 07/04/2020   PLT 273 07/04/2020   Last metabolic panel Lab Results  Component Value Date   GLUCOSE 89 07/04/2020   NA 137 07/04/2020   K 4.2 07/04/2020   CL 102 07/04/2020   CO2 22 07/04/2020   BUN 13 07/04/2020  CREATININE 0.83 07/04/2020   GFRNONAA 122 11/01/2019   GFRAA 141 11/01/2019   CALCIUM 9.7 07/04/2020   PROT 7.1 07/04/2020   ALBUMIN 4.4 07/04/2020   LABGLOB 2.7 07/04/2020   AGRATIO 1.6 07/04/2020   BILITOT 0.2 07/04/2020   ALKPHOS 67 07/04/2020   AST 29 07/04/2020   ALT 28 07/04/2020   Last thyroid functions Lab Results  Component Value Date   TSH 1.400 07/04/2020       Objective    BP 128/86 (BP Location: Right Arm, Patient Position: Sitting, Cuff Size: Normal)   Pulse 77   Wt 183 lb (83 kg)   SpO2 100%   BMI 27.02 kg/m  BP Readings from Last 3 Encounters:  12/11/20 128/86  07/04/20  122/81  01/24/20 126/82   Wt Readings from Last 3 Encounters:  12/11/20 183 lb (83 kg)  07/04/20 182 lb (82.6 kg)  01/24/20 205 lb (93 kg)    Physical Exam    No results found for any visits on 12/11/20.  Assessment & Plan     1. Panic attacks Some increase in anxiety with break up with partner recently. Will increase Zoloft to 100 mg qd.  - sertraline (ZOLOFT) 100 MG tablet; Take 1 tablet (100 mg total) by mouth daily.  Dispense: 30 tablet; Refill: 3  2. Situational stress Extra stress with relationship dissolving and extra duties at work with not enough help. New help has been hired and slowly improving workload. Recommend she eat 3 nutritious meals and exercise 30 minutes 4-5 days a week at the gym. Increase Zoloft to 100 mg qd and recheck in this office in 4-6 weeks. - sertraline (ZOLOFT) 100 MG tablet; Take 1 tablet (100 mg total) by mouth daily.  Dispense: 30 tablet; Refill: 3   No follow-ups on file.      I, Peter Daquila, PA-C, have reviewed all documentation for this visit. The documentation on 12/11/20 for the exam, diagnosis, procedures, and orders are all accurate and complete.    Dortha Kern, PA-C  [employer] [phone] (phone) [phone] (fax)  [facility]

## 2021-03-22 ENCOUNTER — Telehealth: Payer: Self-pay | Admitting: Family Medicine

## 2021-03-22 DIAGNOSIS — J069 Acute upper respiratory infection, unspecified: Secondary | ICD-10-CM

## 2021-03-22 NOTE — Patient Instructions (Addendum)
I appreciate the opportunity to provide you with care for your health and wellness.  Work note  Mask   Recommend flu and covid testing   Please continue to practice social distancing to keep you, your family, and our community safe.  If you must go out, please wear a mask and practice good handwashing.    Have a wonderful day. With Gratitude, Tereasa Coop, DNP, AGNP-BC

## 2021-03-22 NOTE — Progress Notes (Signed)
Virtual Visit Consent   Melissa Small, you are scheduled for a virtual visit with a [facility] provider today.     Just as with appointments in the office, your consent must be obtained to participate.  Your consent will be active for this visit and any virtual visit you may have with one of our providers in the next 365 days.     If you have a MyChart account, a copy of this consent can be sent to you electronically.  All virtual visits are billed to your insurance company just like a traditional visit in the office.    As this is a virtual visit, video technology does not allow for your provider to perform a traditional examination.  This may limit your provider's ability to fully assess your condition.  If your provider identifies any concerns that need to be evaluated in person or the need to arrange testing (such as labs, EKG, etc.), we will make arrangements to do so.     Although advances in technology are sophisticated, we cannot ensure that it will always work on either your end or our end.  If the connection with a video visit is poor, the visit may have to be switched to a telephone visit.  With either a video or telephone visit, we are not always able to ensure that we have a secure connection.     I need to obtain your verbal consent now.   Are you willing to proceed with your visit today?    Melissa Small has provided verbal consent on 03/22/2021 for a virtual visit (video or telephone).   Melissa Finner, NP   Date: 03/22/2021 12:32 PM   Virtual Visit via Video Note   I, Melissa Small, connected with  Melissa Small  ([id number], December 30, 1995) on 03/22/21 at 12:30 PM EST by a video-enabled telemedicine application and verified that I am speaking with the correct person using two identifiers.  Location: Patient: Virtual Visit Location Patient: Home Provider: Virtual Visit Location Provider: Home Office   I discussed the limitations of evaluation and  management by telemedicine and the availability of in person appointments. The patient expressed understanding and agreed to proceed.    History of Present Illness: Melissa Small is a 25 y.o. who identifies as a female who was assigned female at birth, and is being seen today for woke up with a sore throat- feels like tonsils are swollen. Nasal congestion. No fever 97.8. Sore throat. Mild Cough. Overall feels ok- does not want medication, but is required to have a work note. Has not been tested for COVID or Flu  Denies fevers, chills, shortness of breath, chest pain, ear pain. Problems:  Patient Active Problem List   Diagnosis Date Noted   H/O self-harm 05/17/2018   Hx of migraine headaches 12/29/2017   Panic attacks 12/17/2015   Chronic knee pain 11/28/2015   Obesity 01/11/2015   Elevated blood pressure reading without diagnosis of hypertension 12/19/2013    Allergies:  Allergies  Allergen Reactions   Meloxicam Nausea Only   Medications:  Current Outpatient Medications:    aspirin-acetaminophen-caffeine (EXCEDRIN MIGRAINE) 250-250-65 MG tablet, Take 2 tablets by mouth every 6 (six) hours as needed for headache., Disp: , Rfl:    sertraline (ZOLOFT) 100 MG tablet, Take 1 tablet (100 mg total) by mouth daily., Disp: 30 tablet, Rfl: 3  Observations/Objective: Patient is well-developed, well-nourished in no acute distress.  Resting comfortably  at home.  Head  is normocephalic, atraumatic.  No labored breathing.  Speech is clear and coherent with logical content.  Patient is alert and oriented at baseline.    Assessment and Plan:  1. Viral URI Work noted provided Encouraged covid and flu testing and mask wearing even if negative  Patient acknowledged agreement and understanding of the plan.   I discussed the assessment and treatment plan with the patient. The patient was provided an opportunity to ask questions and all were answered. The patient agreed with the plan and  demonstrated an understanding of the instructions.   The patient was advised to call back or seek an in-person evaluation if the symptoms worsen or if the condition fails to improve as anticipated.   The above assessment and management plan was discussed with the patient. The patient verbalized understanding of and has agreed to the management plan. Patient is aware to call the clinic if symptoms persist or worsen. Patient is aware when to return to the clinic for a follow-up visit. Patient educated on when it is appropriate to go to the emergency department.     Follow Up Instructions: I discussed the assessment and treatment plan with the patient. The patient was provided an opportunity to ask questions and all were answered. The patient agreed with the plan and demonstrated an understanding of the instructions.  A copy of instructions were sent to the patient via MyChart unless otherwise noted below.     The patient was advised to call back or seek an in-person evaluation if the symptoms worsen or if the condition fails to improve as anticipated.  Time:  I spent 10 minutes with the patient via telehealth technology discussing the above problems/concerns.    Melissa Finner, NP

## 2021-04-19 ENCOUNTER — Encounter: Payer: Self-pay | Admitting: Family Medicine

## 2021-04-19 ENCOUNTER — Encounter: Payer: Self-pay | Admitting: Physician Assistant

## 2021-04-19 DIAGNOSIS — F410 Panic disorder [episodic paroxysmal anxiety] without agoraphobia: Secondary | ICD-10-CM

## 2021-04-19 DIAGNOSIS — F439 Reaction to severe stress, unspecified: Secondary | ICD-10-CM

## 2021-04-19 MED ORDER — SERTRALINE HCL 100 MG PO TABS: 100.0000 mg | ORAL_TABLET | Freq: Every day | ORAL | 0 refills | Status: DC

## 2021-05-04 ENCOUNTER — Encounter: Payer: Self-pay | Admitting: Physician Assistant

## 2021-05-16 ENCOUNTER — Other Ambulatory Visit: Payer: Self-pay

## 2021-05-16 ENCOUNTER — Encounter: Payer: Self-pay | Admitting: Family Medicine

## 2021-05-16 ENCOUNTER — Telehealth (INDEPENDENT_AMBULATORY_CARE_PROVIDER_SITE_OTHER): Payer: BC Managed Care – PPO | Admitting: Family Medicine

## 2021-05-16 DIAGNOSIS — F430 Acute stress reaction: Secondary | ICD-10-CM | POA: Diagnosis not present

## 2021-05-16 DIAGNOSIS — F439 Reaction to severe stress, unspecified: Secondary | ICD-10-CM | POA: Diagnosis not present

## 2021-05-16 DIAGNOSIS — F418 Other specified anxiety disorders: Secondary | ICD-10-CM | POA: Insufficient documentation

## 2021-05-16 DIAGNOSIS — F410 Panic disorder [episodic paroxysmal anxiety] without agoraphobia: Secondary | ICD-10-CM | POA: Diagnosis not present

## 2021-05-16 MED ORDER — SERTRALINE HCL 100 MG PO TABS: 100.0000 mg | ORAL_TABLET | Freq: Every day | ORAL | 3 refills | Status: DC

## 2021-05-16 NOTE — Assessment & Plan Note (Signed)
Stable with use of SSRI Use of art and exercise to assist with stressors Denies excess exercise Reports up to 120 mins per week of gym time Denies SI/HI Contracted to safety Hx of cutting behavior; has stopped for >4 years

## 2021-05-16 NOTE — Assessment & Plan Note (Signed)
Removed herself from the area of stress if able Listens to music No longer self harms Reports healthy habits with exercise and food Stable relationship with arts outside of work for enjoyment No hx of benzo addiction

## 2021-05-16 NOTE — Progress Notes (Signed)
MyChart Video Visit    Virtual Visit via Video Note   This visit type was conducted due to national recommendations for restrictions regarding the COVID-19 Pandemic (e.g. social distancing) in an effort to limit this patient's exposure and mitigate transmission in our community. This patient is at least at moderate risk for complications without adequate follow up. This format is felt to be most appropriate for this patient at this time. Physical exam was limited by quality of the video and audio technology used for the visit.   Patient location: home studio  Provider location: [facility]  I discussed the limitations of evaluation and management by telemedicine and the availability of in person appointments. The patient expressed understanding and agreed to proceed.  Patient: Melissa Small   DOB: 07-18-1995   26 y.o. Female  MRN: [id number] Visit Date: 05/16/2021  Today's healthcare provider: Jacky Kindle, FNP   Chief Complaint  Patient presents with   Follow-up   Subjective    HPI  Panic Attacks/ Situational Stress, Follow-up  She was last seen for anxiety 4 months ago. Changes made at last visit include increased Zoloft to 100mg .   She reports excellent compliance with treatment. She reports good tolerance of treatment. She is not having side effects.   She feels her anxiety is mild and Improved since last visit.  Symptoms: No chest pain No difficulty concentrating  No dizziness No fatigue  No feelings of losing control No insomnia  No irritable No palpitations  Yes panic attacks No racing thoughts  No shortness of breath No sweating  No tremors/shakes    GAD-7 Results GAD-7 Generalized Anxiety Disorder Screening Tool 10/31/2019  1. Feeling Nervous, Anxious, or on Edge 1  2. Not Being Able to Stop or Control Worrying 0  3. Worrying Too Much About Different Things 1  4. Trouble Relaxing 0  5. Being So Restless it's Hard To Sit Still 0  6. Becoming Easily  Annoyed or Irritable 3  7. Feeling Afraid As If Something Awful Might Happen 0  Total GAD-7 Score 5  Difficulty At Work, Home, or Getting  Along With Others? Somewhat difficult    PHQ-9 Scores PHQ9 SCORE ONLY 07/04/2020 06/04/2018 02/19/2017  PHQ-9 Total Score 1 2 4     ---------------------------------------------------------------------------------------------------    Medications: Outpatient Medications Prior to Visit  Medication Sig   [DISCONTINUED] sertraline (ZOLOFT) 100 MG tablet Take 1 tablet (100 mg total) by mouth daily.   [DISCONTINUED] aspirin-acetaminophen-caffeine (EXCEDRIN MIGRAINE) 250-250-65 MG tablet Take 2 tablets by mouth every 6 (six) hours as needed for headache.   No facility-administered medications prior to visit.    Review of Systems    Objective    There were no vitals taken for this visit.   Physical Exam HENT:     Head: Normocephalic.  Pulmonary:     Effort: Pulmonary effort is normal.  Neurological:     Mental Status: She is alert.  Psychiatric:        Mood and Affect: Mood normal.        Behavior: Behavior normal.        Thought Content: Thought content normal.        Judgment: Judgment normal.       Assessment & Plan     Problem List Items Addressed This Visit       Other   Anxiety associated with depression - Primary    Stable with use of SSRI Use of art and exercise to assist  with stressors Denies excess exercise Reports up to 120 mins per week of gym time Denies SI/HI Contracted to safety Hx of cutting behavior; has stopped for >4 years      Relevant Medications   sertraline (ZOLOFT) 100 MG tablet   Situational stress    In the work place; when she does not get a break, things are not going well Takes a walk- to decompress Able to listen to music       Relevant Medications   sertraline (ZOLOFT) 100 MG tablet   Panic attack as reaction to stress    Removed herself from the area of stress if able Listens to  music No longer self harms Reports healthy habits with exercise and food Stable relationship with arts outside of work for enjoyment No hx of benzo addiction      Relevant Medications   sertraline (ZOLOFT) 100 MG tablet   RESOLVED: Panic attacks   Relevant Medications   sertraline (ZOLOFT) 100 MG tablet    Patient seen and examined by Merita Norton,  FNP note scribed by Sheliah Hatch, NCMA   No follow-ups on file.     I discussed the assessment and treatment plan with the patient. The patient was provided an opportunity to ask questions and all were answered. The patient agreed with the plan and demonstrated an understanding of the instructions.   The patient was advised to call back or seek an in-person evaluation if the symptoms worsen or if the condition fails to improve as anticipated.  I provided 13 minutes of non-face-to-face time during this encounter.  Leilani Merl, FNP, have reviewed all documentation for this visit. The documentation on 05/16/21 for the exam, diagnosis, procedures, and orders are all accurate and complete.   Jacky Kindle, FNP [facility] [phone] (phone) [phone] (fax)  [facility]

## 2021-05-16 NOTE — Assessment & Plan Note (Signed)
In the work place; when she does not get a break, things are not going well Takes a walk- to decompress Able to listen to music

## 2021-05-19 ENCOUNTER — Encounter: Payer: Self-pay | Admitting: Family Medicine

## 2021-05-23 ENCOUNTER — Ambulatory Visit: Payer: BC Managed Care – PPO | Admitting: Family Medicine

## 2021-05-23 ENCOUNTER — Other Ambulatory Visit: Payer: Self-pay

## 2021-05-23 ENCOUNTER — Encounter: Payer: Self-pay | Admitting: Family Medicine

## 2021-05-23 VITALS — BP 123/81 | HR 81 | Resp 16 | Wt 193.2 lb

## 2021-05-23 DIAGNOSIS — L309 Dermatitis, unspecified: Secondary | ICD-10-CM | POA: Insufficient documentation

## 2021-05-23 DIAGNOSIS — L259 Unspecified contact dermatitis, unspecified cause: Secondary | ICD-10-CM | POA: Diagnosis not present

## 2021-05-23 DIAGNOSIS — N898 Other specified noninflammatory disorders of vagina: Secondary | ICD-10-CM | POA: Diagnosis not present

## 2021-05-23 MED ORDER — CLOBETASOL PROPIONATE 0.05 % EX OINT: 1.0000 "application " | TOPICAL_OINTMENT | Freq: Two times a day (BID) | CUTANEOUS | 0 refills | Status: DC

## 2021-05-23 MED ORDER — HYDROXYZINE HCL 10 MG PO TABS: 10.0000 mg | ORAL_TABLET | Freq: Three times a day (TID) | ORAL | 1 refills | Status: DC | PRN

## 2021-05-23 NOTE — Assessment & Plan Note (Signed)
Thin discharge, does not appear to be yeast Has had multiple partners, WSW Previous blood testing at [facility]; repeat with vaginal swab today

## 2021-05-23 NOTE — Progress Notes (Signed)
Established patient visit   Patient: Melissa Small   DOB: 1996/04/24   26 y.o. Female  MRN: [id number] Visit Date: 05/23/2021  Today's healthcare provider: Gwyneth Sprout, FNP   Chief Complaint  Patient presents with   Vaginal Discharge   Eczema    Patient states that she has concerns of skin being dry and itchy and has been present for several months, patient states she has a PMH of eczema and has been using oatmeal lotion and wash. She denies any changes to skin care products, new foods or medications.    Subjective    Vaginal Discharge The patient's primary symptoms include genital itching and vaginal discharge. This is a recurrent problem. The current episode started 1 to 4 weeks ago. The problem affects both sides. Pertinent negatives include no abdominal pain, anorexia, back pain, chills, constipation, diarrhea, discolored urine, dysuria, fever, flank pain, frequency, headaches, hematuria, joint pain, joint swelling, nausea, painful intercourse, rash, sore throat, urgency or vomiting. The vaginal discharge was thick and white. There has been no bleeding. She has tried antifungals for the symptoms. The treatment provided no relief. She is sexually active.  HPI     Eczema    Additional comments: Patient states that she has concerns of skin being dry and itchy and has been present for several months, patient states she has a PMH of eczema and has been using oatmeal lotion and wash. She denies any changes to skin care products, new foods or medications.       Last edited by Minette Headland, CMA on 05/23/2021 10:49 AM.       Medications: Outpatient Medications Prior to Visit  Medication Sig   sertraline (ZOLOFT) 100 MG tablet Take 1 tablet (100 mg total) by mouth daily.   No facility-administered medications prior to visit.    Review of Systems  Constitutional:  Negative for chills and fever.  HENT:  Negative for sore throat.   Gastrointestinal:  Negative for  abdominal pain, anorexia, constipation, diarrhea, nausea and vomiting.  Genitourinary:  Positive for vaginal discharge. Negative for dysuria, flank pain, frequency, hematuria and urgency.  Musculoskeletal:  Negative for back pain and joint pain.  Skin:  Negative for rash.  Neurological:  Negative for headaches.      Objective    BP 123/81    Pulse 81    Resp 16    Wt 193 lb 3.2 oz (87.6 kg)    LMP 04/26/2021 (Exact Date)    SpO2 99%    BMI 28.53 kg/m    Physical Exam Vitals and nursing note reviewed.  Constitutional:      General: She is not in acute distress.    Appearance: Normal appearance. She is overweight. She is not ill-appearing, toxic-appearing or diaphoretic.  HENT:     Head: Normocephalic and atraumatic.  Cardiovascular:     Rate and Rhythm: Normal rate and regular rhythm.     Pulses: Normal pulses.     Heart sounds: Normal heart sounds. No murmur heard.   No friction rub. No gallop.  Pulmonary:     Effort: Pulmonary effort is normal. No respiratory distress.     Breath sounds: Normal breath sounds. No stridor. No wheezing, rhonchi or rales.  Chest:     Chest wall: No tenderness.  Abdominal:     General: Bowel sounds are normal.     Palpations: Abdomen is soft.  Musculoskeletal:        General: No swelling,  tenderness, deformity or signs of injury. Normal range of motion.     Right lower leg: No edema.     Left lower leg: No edema.  Skin:    General: Skin is warm and dry.     Capillary Refill: Capillary refill takes less than 2 seconds.     Coloration: Skin is not jaundiced or pale.     Findings: No bruising, erythema, lesion or rash.  Neurological:     General: No focal deficit present.     Mental Status: She is alert and oriented to person, place, and time. Mental status is at baseline.     Cranial Nerves: No cranial nerve deficit.     Sensory: No sensory deficit.     Motor: No weakness.     Coordination: Coordination normal.  Psychiatric:        Mood  and Affect: Mood normal.        Behavior: Behavior normal.        Thought Content: Thought content normal.        Judgment: Judgment normal.     No results found for any visits on 05/23/21.  Assessment & Plan     Problem List Items Addressed This Visit       Musculoskeletal and Integument   Eczema of both upper extremities    Trial of steroid cream Recommend small pea size amounts      Relevant Medications   clobetasol ointment (TEMOVATE) 0.05 %   Contact dermatitis    Record allergic episodes Wash off known exposures  Refer to allergy and derm       Relevant Medications   hydrOXYzine (ATARAX) 10 MG tablet     Other   Vaginal discharge - Primary    Thin discharge, does not appear to be yeast Has had multiple partners, WSW Previous blood testing at [facility]; repeat with vaginal swab today      Relevant Orders   NuSwab Vaginitis Plus (VG+)     Return if symptoms worsen or fail to improve.      Vonna Kotyk, FNP, have reviewed all documentation for this visit. The documentation on 05/23/21 for the exam, diagnosis, procedures, and orders are all accurate and complete.  Patient seen and examined by Tally Joe,  FNP note scribed by [employer], [place], [place] [phone] (phone) [phone] (fax)  [place]

## 2021-05-23 NOTE — Assessment & Plan Note (Signed)
Record allergic episodes Wash off known exposures  Refer to allergy and derm

## 2021-05-23 NOTE — Assessment & Plan Note (Signed)
Trial of steroid cream Recommend small pea size amounts

## 2021-05-24 DIAGNOSIS — N898 Other specified noninflammatory disorders of vagina: Secondary | ICD-10-CM | POA: Diagnosis not present

## 2021-05-26 ENCOUNTER — Encounter: Payer: Self-pay | Admitting: Family Medicine

## 2021-05-26 LAB — NUSWAB VAGINITIS PLUS (VG+)
Atopobium vaginae: HIGH Score — AB
BVAB 2: HIGH Score — AB
Candida albicans, NAA: POSITIVE — AB
Candida glabrata, NAA: NEGATIVE
Chlamydia trachomatis, NAA: NEGATIVE
Neisseria gonorrhoeae, NAA: NEGATIVE
Trich vag by NAA: NEGATIVE

## 2021-05-26 LAB — SPECIMEN STATUS REPORT

## 2021-05-27 ENCOUNTER — Other Ambulatory Visit: Payer: Self-pay | Admitting: Family Medicine

## 2021-05-27 ENCOUNTER — Encounter: Payer: Self-pay | Admitting: Family Medicine

## 2021-05-27 DIAGNOSIS — B9689 Other specified bacterial agents as the cause of diseases classified elsewhere: Secondary | ICD-10-CM

## 2021-05-27 DIAGNOSIS — B379 Candidiasis, unspecified: Secondary | ICD-10-CM

## 2021-05-27 MED ORDER — METRONIDAZOLE 500 MG PO TABS: 500.0000 mg | ORAL_TABLET | Freq: Two times a day (BID) | ORAL | 0 refills | Status: AC

## 2021-05-27 MED ORDER — FLUCONAZOLE 150 MG PO TABS: ORAL_TABLET | ORAL | 0 refills | Status: DC

## 2021-05-27 MED ORDER — METRONIDAZOLE 500 MG PO TABS: 500.0000 mg | ORAL_TABLET | Freq: Two times a day (BID) | ORAL | 0 refills | Status: DC

## 2021-06-05 ENCOUNTER — Encounter: Payer: Self-pay | Admitting: Family Medicine

## 2021-06-07 ENCOUNTER — Other Ambulatory Visit: Payer: Self-pay | Admitting: Family Medicine

## 2021-06-07 MED ORDER — FLUCONAZOLE 150 MG PO TABS: ORAL_TABLET | ORAL | 0 refills | Status: DC

## 2021-06-19 ENCOUNTER — Ambulatory Visit: Payer: BC Managed Care – PPO | Admitting: Family Medicine

## 2021-07-24 ENCOUNTER — Ambulatory Visit: Payer: Self-pay

## 2021-07-24 DIAGNOSIS — Z20822 Contact with and (suspected) exposure to covid-19: Secondary | ICD-10-CM | POA: Diagnosis not present

## 2021-07-24 NOTE — Telephone Encounter (Signed)
?  Chief Complaint: COVID + - Also vomiting this morning 3-5 times ?Symptoms: Pt vomited 3-5 times this morning also had fever of 102. ?Frequency: Since this morning ?Pertinent Negatives: Patient denies SOB,  ?Disposition: [] ED /[] Urgent Care (no appt availability in office) / [x] Appointment(In office/virtual)/ []  [place] [facility]/ [] Home Care/ [] Refused Recommended Disposition /[] [place] [facility]/ [x]  Follow-up with PCP ?Additional Notes: Pt has virtual appointment tomorrow.  Pt had vomiting this morning but ate a calzone this afternoon without stomach upset. ? ? ? ?Reason for Disposition ? [1] COVID-19 diagnosed by positive lab test (e.g., PCR, rapid self-test kit) AND [2] mild symptoms (e.g., cough, fever, others) AND [0] no complications or SOB ? ?Answer Assessment - Initial Assessment Questions ?1. COVID-19 DIAGNOSIS: "Who made your COVID-19 diagnosis?" "Was it confirmed by a positive lab test or self-test?" If not diagnosed by a doctor (or NP/PA), ask "Are there lots of cases (community spread) where you live?" Note: See public health department website, if unsure. ?    At home today - [facility] at 3:30 ?2. COVID-19 EXPOSURE: "Was there any known exposure to COVID before the symptoms began?" CDC Definition of close contact: within 6 feet (2 meters) for a total of 15 minutes or more over a 24-hour period.  ?    Yes ?3. ONSET: "When did the COVID-19 symptoms start?"  ?    This morning ?4. WORST SYMPTOM: "What is your worst symptom?" (e.g., cough, fever, shortness of breath, muscle aches) ?    Fever and HA ?5. COUGH: "Do you have a cough?" If Yes, ask: "How bad is the cough?"   ?    Yes - not bad ?6. FEVER: "Do you have a fever?" If Yes, ask: "What is your temperature, how was it measured, and when did it start?" ?    This morning 102  - now 99.5 ?7. RESPIRATORY STATUS: "Describe your breathing?" (e.g., shortness of breath, wheezing, unable to speak)  ?    ok ?8. BETTER-SAME-WORSE: "Are you getting  better, staying the same or getting worse compared to yesterday?"  If getting worse, ask, "In what way?" ?    worse ?9. HIGH RISK DISEASE: "Do you have any chronic medical problems?" (e.g., asthma, heart or lung disease, weak immune system, obesity, etc.) ?    no ?10. VACCINE: "Have you had the COVID-19 vaccine?" If Yes, ask: "Which one, how many shots, when did you get it?" ?      Yes - 2 ?11. BOOSTER: "Have you received your COVID-19 booster?" If Yes, ask: "Which one and when did you get it?" ?      no ?12. PREGNANCY: "Is there any chance you are pregnant?" "When was your last menstrual period?" ?      no ?13. OTHER SYMPTOMS: "Do you have any other symptoms?"  (e.g., chills, fatigue, headache, loss of smell or taste, muscle pain, sore throat) ?      no ?14. O2 SATURATION MONITOR:  "Do you use an oxygen saturation monitor (pulse oximeter) at home?" If Yes, ask "What is your reading (oxygen level) today?" "What is your usual oxygen saturation reading?" (e.g., 95%) ?      na ? ?Protocols used: Coronavirus (YTKZS-01) Diagnosed or Suspected-A-AH ? ?

## 2021-07-25 ENCOUNTER — Other Ambulatory Visit: Payer: Self-pay

## 2021-07-25 ENCOUNTER — Telehealth (INDEPENDENT_AMBULATORY_CARE_PROVIDER_SITE_OTHER): Payer: BC Managed Care – PPO | Admitting: Family Medicine

## 2021-07-25 DIAGNOSIS — Z91199 Patient's noncompliance with other medical treatment and regimen due to unspecified reason: Secondary | ICD-10-CM

## 2021-07-26 DIAGNOSIS — Z91199 Patient's noncompliance with other medical treatment and regimen due to unspecified reason: Secondary | ICD-10-CM | POA: Insufficient documentation

## 2021-07-26 NOTE — Progress Notes (Signed)
Patient wished to cancel appointment; no charge associated. ?Gwyneth Sprout, FNP  ?[facility] ?[address] ?[place], [place] [address] ?[phone] (phone) [phone] (fax) ?[facility] ? ?

## 2021-12-21 ENCOUNTER — Telehealth: Payer: Self-pay | Admitting: Nurse Practitioner

## 2021-12-21 DIAGNOSIS — B9789 Other viral agents as the cause of diseases classified elsewhere: Secondary | ICD-10-CM

## 2021-12-21 DIAGNOSIS — J329 Chronic sinusitis, unspecified: Secondary | ICD-10-CM

## 2021-12-21 MED ORDER — FLUTICASONE PROPIONATE 50 MCG/ACT NA SUSP: 2.0000 | Freq: Every day | NASAL | 6 refills | Status: DC

## 2021-12-21 NOTE — Progress Notes (Signed)
E-Visit for Sinus Problems  We are sorry that you are not feeling well.  Here is how we plan to help!  Based on what you have shared with me it looks like you have sinusitis.  Sinusitis is inflammation and infection in the sinus cavities of the head.  Based on your presentation I believe you most likely have Acute Viral Sinusitis.This is an infection most likely caused by a virus. There is not specific treatment for viral sinusitis other than to help you with the symptoms until the infection runs its course.  You may use an oral decongestant such as Mucinex D or if you have glaucoma or high blood pressure use plain Mucinex. Saline nasal spray help and can safely be used as often as needed for congestion, I have prescribed: Fluticasone nasal spray two sprays in each nostril once a day   Some authorities believe that zinc sprays or the use of Echinacea may shorten the course of your symptoms.  Sinus infections are not as easily transmitted as other respiratory infection, however we still recommend that you avoid close contact with loved ones, especially the very young and elderly.  Remember to wash your hands thoroughly throughout the day as this is the number one way to prevent the spread of infection!  Home Care: Only take medications as instructed by your medical team. Do not take these medications with alcohol. A steam or ultrasonic humidifier can help congestion.  You can place a towel over your head and breathe in the steam from hot water coming from a faucet. Avoid close contacts especially the very young and the elderly. Cover your mouth when you cough or sneeze. Always remember to wash your hands.  Get Help Right Away If: You develop worsening fever or sinus pain. You develop a severe head ache or visual changes. Your symptoms persist after you have completed your treatment plan.  Make sure you Understand these instructions. Will watch your condition. Will get help right away if you  are not doing well or get worse.  Your work note is in your my chart.  Thank you for choosing an e-visit.  Your e-visit answers were reviewed by a board certified advanced clinical practitioner to complete your personal care plan. Depending upon the condition, your plan could have included both over the counter or prescription medications.  Please review your pharmacy choice. Make sure the pharmacy is open so you can pick up prescription now. If there is a problem, you may contact your provider through [employer] and have the prescription routed to another pharmacy.  Your safety is important to [place]. If you have drug allergies check your prescription carefully.   For the next 24 hours you can use MyChart to ask questions about today's visit, request a non-urgent call back, or ask for a work or school excuse. You will get an email in the next two days asking about your experience. I hope that your e-visit has been valuable and will speed your recovery.  5-10 minutes spent reviewing and documenting in chart.

## 2022-05-16 ENCOUNTER — Other Ambulatory Visit: Payer: Self-pay | Admitting: Family Medicine

## 2022-05-16 ENCOUNTER — Encounter: Payer: Self-pay | Admitting: Family Medicine

## 2022-05-16 DIAGNOSIS — F439 Reaction to severe stress, unspecified: Secondary | ICD-10-CM

## 2022-05-16 DIAGNOSIS — F410 Panic disorder [episodic paroxysmal anxiety] without agoraphobia: Secondary | ICD-10-CM

## 2022-05-16 MED ORDER — SERTRALINE HCL 100 MG PO TABS: 100.0000 mg | ORAL_TABLET | Freq: Every day | ORAL | 3 refills | Status: DC

## 2022-05-16 NOTE — Telephone Encounter (Signed)
Please review.  KP

## 2022-05-25 ENCOUNTER — Other Ambulatory Visit: Payer: Self-pay | Admitting: Family Medicine

## 2022-05-25 DIAGNOSIS — F410 Panic disorder [episodic paroxysmal anxiety] without agoraphobia: Secondary | ICD-10-CM

## 2022-05-25 DIAGNOSIS — F439 Reaction to severe stress, unspecified: Secondary | ICD-10-CM

## 2023-03-31 ENCOUNTER — Ambulatory Visit: Payer: BC Managed Care – PPO | Admitting: Family Medicine

## 2023-03-31 ENCOUNTER — Encounter: Payer: Self-pay | Admitting: Family Medicine

## 2023-03-31 VITALS — Ht 70.0 in | Wt 192.0 lb

## 2023-03-31 DIAGNOSIS — E162 Hypoglycemia, unspecified: Secondary | ICD-10-CM | POA: Diagnosis not present

## 2023-03-31 DIAGNOSIS — R42 Dizziness and giddiness: Secondary | ICD-10-CM

## 2023-03-31 DIAGNOSIS — R030 Elevated blood-pressure reading, without diagnosis of hypertension: Secondary | ICD-10-CM | POA: Diagnosis not present

## 2023-03-31 DIAGNOSIS — F39 Unspecified mood [affective] disorder: Secondary | ICD-10-CM | POA: Insufficient documentation

## 2023-03-31 DIAGNOSIS — Z3041 Encounter for surveillance of contraceptive pills: Secondary | ICD-10-CM

## 2023-03-31 DIAGNOSIS — Z6827 Body mass index (BMI) 27.0-27.9, adult: Secondary | ICD-10-CM

## 2023-03-31 DIAGNOSIS — L700 Acne vulgaris: Secondary | ICD-10-CM

## 2023-03-31 MED ORDER — DROSPIRENONE-ETHINYL ESTRADIOL 3-0.02 MG PO TABS
1.0000 | ORAL_TABLET | Freq: Every day | ORAL | 4 refills | Status: DC
Start: 2023-03-31 — End: 2023-06-03

## 2023-03-31 MED ORDER — SPIRONOLACTONE 25 MG PO TABS: 25.0000 mg | ORAL_TABLET | Freq: Every day | ORAL | 3 refills | Status: DC

## 2023-03-31 MED ORDER — SERTRALINE HCL 50 MG PO TABS: ORAL_TABLET | ORAL | 0 refills | Status: DC

## 2023-03-31 NOTE — Progress Notes (Signed)
Established patient visit  Patient: Melissa Small   DOB: Nov 07, 1995   27 y.o. Female  MRN: [id number] Visit Date: 03/31/2023  Today's healthcare provider: Jacky Kindle, FNP  Introduced to nurse practitioner role and practice setting.  All questions answered.  Discussed provider/patient relationship and expectations.  Subjective    HPI HPI   Medication refill-birth control and anxiety F/u--light headed is getting worse especially when sitting then stand, feel fainting, seeing black dots.  Last edited by Shelly Bombard, CMA on 03/31/2023  9:47 AM.      Medications: Outpatient Medications Prior to Visit  Medication Sig   LORYNA 3-0.02 MG tablet    [DISCONTINUED] spironolactone (ALDACTONE) 25 MG tablet    [DISCONTINUED] clobetasol ointment (TEMOVATE) 0.05 % Apply 1 application topically 2 (two) times daily.   [DISCONTINUED] fluconazole (DIFLUCAN) 150 MG tablet Take 1 tablet, by mouth; if symptoms remain after three days- then take the second tablet, by mouth.   [DISCONTINUED] fluticasone (FLONASE) 50 MCG/ACT nasal spray Place 2 sprays into both nostrils daily.   [DISCONTINUED] hydrOXYzine (ATARAX) 10 MG tablet Take 1 tablet (10 mg total) by mouth every 8 (eight) hours as needed for itching.   [DISCONTINUED] sertraline (ZOLOFT) 100 MG tablet Take 1 tablet (100 mg total) by mouth daily. (Patient not taking: Reported on 03/31/2023)   No facility-administered medications prior to visit.   Last CBC Lab Results  Component Value Date   WBC 7.8 07/04/2020   HGB 13.7 07/04/2020   HCT 40.5 07/04/2020   MCV 94 07/04/2020   MCH 31.9 07/04/2020   RDW 12.1 07/04/2020   PLT 273 07/04/2020   Last metabolic panel Lab Results  Component Value Date   GLUCOSE 89 07/04/2020   NA 137 07/04/2020   K 4.2 07/04/2020   CL 102 07/04/2020   CO2 22 07/04/2020   BUN 13 07/04/2020   CREATININE 0.83 07/04/2020   EGFR 100 07/04/2020   CALCIUM 9.7 07/04/2020   PROT 7.1 07/04/2020   ALBUMIN  4.4 07/04/2020   LABGLOB 2.7 07/04/2020   AGRATIO 1.6 07/04/2020   BILITOT 0.2 07/04/2020   ALKPHOS 67 07/04/2020   AST 29 07/04/2020   ALT 28 07/04/2020   Last hemoglobin A1c Lab Results  Component Value Date   HGBA1C 4.8 07/04/2020   Last thyroid functions Lab Results  Component Value Date   TSH 1.400 07/04/2020     Objective    Ht 5\' 10"  (1.778 m)   Wt 192 lb (87.1 kg)   LMP 03/08/2023   SpO2 98%   BMI 27.55 kg/m   BP Readings from Last 3 Encounters:  05/23/21 123/81  12/11/20 128/86  07/04/20 122/81   Wt Readings from Last 3 Encounters:  03/31/23 192 lb (87.1 kg)  05/23/21 193 lb 3.2 oz (87.6 kg)  12/11/20 183 lb (83 kg)   Physical Exam Vitals and nursing note reviewed.  Constitutional:      General: She is not in acute distress.    Appearance: Normal appearance. She is overweight. She is not ill-appearing, toxic-appearing or diaphoretic.  HENT:     Head: Normocephalic and atraumatic.  Cardiovascular:     Rate and Rhythm: Normal rate and regular rhythm.     Pulses: Normal pulses.     Heart sounds: Normal heart sounds. No murmur heard.    No friction rub. No gallop.  Pulmonary:     Effort: Pulmonary effort is normal. No respiratory distress.     Breath sounds: Normal  breath sounds. No stridor. No wheezing, rhonchi or rales.  Chest:     Chest wall: No tenderness.  Abdominal:     General: Bowel sounds are normal.  Musculoskeletal:        General: No swelling, tenderness, deformity or signs of injury. Normal range of motion.     Cervical back: Normal range of motion and neck supple. No tenderness.     Right lower leg: No edema.     Left lower leg: No edema.  Skin:    General: Skin is warm and dry.     Capillary Refill: Capillary refill takes less than 2 seconds.     Coloration: Skin is not jaundiced or pale.     Findings: No bruising, erythema, lesion or rash.  Neurological:     General: No focal deficit present.     Mental Status: She is alert and  oriented to person, place, and time. Mental status is at baseline.     Cranial Nerves: No cranial nerve deficit.     Sensory: No sensory deficit.     Motor: No weakness.     Coordination: Coordination normal.  Psychiatric:        Mood and Affect: Mood normal.        Behavior: Behavior normal.        Thought Content: Thought content normal.        Judgment: Judgment normal.     No results found for any visits on 03/31/23.  Assessment & Plan     Problem List Items Addressed This Visit       Endocrine   Hypoglycemia    Previous A1c of 4.8% Complaints of near syncope and vision changes with positional changes Was started on spiro  Eval CBC, CMP, TSH, A1c      Relevant Orders   Hemoglobin A1c     Musculoskeletal and Integument   Acne vulgaris    Chronic, improved on spiro Pt wishes to continue Refills provided at 25 mg daily Declines use of low dose doxy to assist at this time       Relevant Medications   LORYNA 3-0.02 MG tablet   drospirenone-ethinyl estradiol (YAZ) 3-0.02 MG tablet     Other   BMI 27.0-27.9,adult    Chronic, stable Body mass index is 27.55 kg/m. Discussed importance of healthy weight management Discussed diet and exercise       Relevant Orders   Lipid panel   Dizziness - Primary    Acute on chronic, worsening in past 2.5 years since last appt Negative ortho static Bps Elevated PHQ and GAD with request to restart SSRI Labs pending       Relevant Orders   B12 and Folate Panel   Vitamin D (25 hydroxy)   Elevated blood pressure reading in office without diagnosis of hypertension    BP elevated; goal remains <119/<79      Relevant Orders   CBC with Differential/Platelet   Comprehensive Metabolic Panel (CMET)   TSH   Mood disorder (HCC)    Chronic, worsening without medication  Last on ssri zoloft at 100 mg Start titration of 50 mg tabs with increase from 25 mg to 50 mg as tolerated for GI side effects F/u in 6 weeks  Denies SI or  HI      Relevant Orders   B12 and Folate Panel   Vitamin D (25 hydroxy)   Surveillance for birth control, oral contraceptives    Request for refills of OCPs; no  concerns       Return in about 6 weeks (around 05/12/2023).     Leilani Merl, FNP, have reviewed all documentation for this visit. The documentation on 03/31/23 for the exam, diagnosis, procedures, and orders are all accurate and complete.  Jacky Kindle, FNP  [facility] [phone] (phone) [phone] (fax)  [facility]

## 2023-03-31 NOTE — Assessment & Plan Note (Signed)
Chronic, worsening without medication  Last on ssri zoloft at 100 mg Start titration of 50 mg tabs with increase from 25 mg to 50 mg as tolerated for GI side effects F/u in 6 weeks  Denies SI or HI

## 2023-03-31 NOTE — Assessment & Plan Note (Signed)
Chronic, improved on spiro Pt wishes to continue Refills provided at 25 mg daily Declines use of low dose doxy to assist at this time

## 2023-03-31 NOTE — Assessment & Plan Note (Signed)
BP elevated; goal remains <119/<79

## 2023-03-31 NOTE — Assessment & Plan Note (Signed)
Previous A1c of 4.8% Complaints of near syncope and vision changes with positional changes Was started on spiro  Eval CBC, CMP, TSH, A1c

## 2023-03-31 NOTE — Assessment & Plan Note (Signed)
Chronic, stable Body mass index is 27.55 kg/m. Discussed importance of healthy weight management Discussed diet and exercise

## 2023-03-31 NOTE — Assessment & Plan Note (Signed)
Acute on chronic, worsening in past 2.5 years since last appt Negative ortho static Bps Elevated PHQ and GAD with request to restart SSRI Labs pending

## 2023-03-31 NOTE — Assessment & Plan Note (Signed)
Request for refills of OCPs; no concerns

## 2023-04-01 ENCOUNTER — Telehealth: Payer: Self-pay

## 2023-04-01 ENCOUNTER — Other Ambulatory Visit: Payer: Self-pay | Admitting: Family Medicine

## 2023-04-01 DIAGNOSIS — E282 Polycystic ovarian syndrome: Secondary | ICD-10-CM

## 2023-04-01 DIAGNOSIS — R7303 Prediabetes: Secondary | ICD-10-CM

## 2023-04-01 LAB — CBC WITH DIFFERENTIAL/PLATELET
Basophils Absolute: 0.1 10*3/uL (ref 0.0–0.2)
Basos: 1 %
EOS (ABSOLUTE): 0.1 10*3/uL (ref 0.0–0.4)
Eos: 1 %
Hematocrit: 38.6 % (ref 34.0–46.6)
Hemoglobin: 12.3 g/dL (ref 11.1–15.9)
Immature Grans (Abs): 0 10*3/uL (ref 0.0–0.1)
Immature Granulocytes: 0 %
Lymphocytes Absolute: 1.4 10*3/uL (ref 0.7–3.1)
Lymphs: 21 %
MCH: 27.6 pg (ref 26.6–33.0)
MCHC: 31.9 g/dL (ref 31.5–35.7)
MCV: 87 fL (ref 79–97)
Monocytes Absolute: 0.5 10*3/uL (ref 0.1–0.9)
Monocytes: 7 %
Neutrophils Absolute: 4.5 10*3/uL (ref 1.4–7.0)
Neutrophils: 70 %
Platelets: 336 10*3/uL (ref 150–450)
RBC: 4.45 x10E6/uL (ref 3.77–5.28)
RDW: 15.8 % — ABNORMAL HIGH (ref 11.7–15.4)
WBC: 6.5 10*3/uL (ref 3.4–10.8)

## 2023-04-01 LAB — COMPREHENSIVE METABOLIC PANEL
ALT: 19 [IU]/L (ref 0–32)
AST: 22 [IU]/L (ref 0–40)
Albumin: 4 g/dL (ref 4.0–5.0)
Alkaline Phosphatase: 68 [IU]/L (ref 44–121)
BUN/Creatinine Ratio: 12 (ref 9–23)
BUN: 11 mg/dL (ref 6–20)
Bilirubin Total: <0.2 mg/dL (ref 0.0–1.2)
CO2: 23 mmol/L (ref 20–29)
Calcium: 8.9 mg/dL (ref 8.7–10.2)
Chloride: 103 mmol/L (ref 96–106)
Creatinine, Ser: 0.9 mg/dL (ref 0.57–1.00)
Globulin, Total: 3 g/dL (ref 1.5–4.5)
Glucose: 103 mg/dL — ABNORMAL HIGH (ref 70–99)
Potassium: 4.4 mmol/L (ref 3.5–5.2)
Sodium: 138 mmol/L (ref 134–144)
Total Protein: 7 g/dL (ref 6.0–8.5)
eGFR: 89 mL/min/{1.73_m2} (ref >59)

## 2023-04-01 LAB — LIPID PANEL
Chol/HDL Ratio: 3.2 {ratio} (ref 0.0–4.4)
Cholesterol, Total: 186 mg/dL (ref 100–199)
HDL: 59 mg/dL (ref >39)
LDL Chol Calc (NIH): 114 mg/dL — ABNORMAL HIGH (ref 0–99)
Triglycerides: 68 mg/dL (ref 0–149)
VLDL Cholesterol Cal: 13 mg/dL (ref 5–40)

## 2023-04-01 LAB — B12 AND FOLATE PANEL
Folate: 8.8 ng/mL (ref >3.0)
Vitamin B-12: 442 pg/mL (ref 232–1245)

## 2023-04-01 LAB — TSH: TSH: 1.91 u[IU]/mL (ref 0.450–4.500)

## 2023-04-01 LAB — HEMOGLOBIN A1C
Est. average glucose Bld gHb Est-mCnc: 114 mg/dL
Hgb A1c MFr Bld: 5.6 % (ref 4.8–5.6)

## 2023-04-01 LAB — VITAMIN D 25 HYDROXY (VIT D DEFICIENCY, FRACTURES): Vit D, 25-Hydroxy: 43 ng/mL (ref 30.0–100.0)

## 2023-04-01 MED ORDER — METFORMIN HCL ER 500 MG PO TB24: 500.0000 mg | ORAL_TABLET | Freq: Every day | ORAL | 3 refills | Status: DC

## 2023-04-01 NOTE — Progress Notes (Signed)
Labs have been reviewed; borderline elevated LDL/bad cholesterol. I continue to recommend diet low in saturated fat and regular exercise - 30 min at least 5 times per week  A1c, 3 month average of blood sugar, now shows borderline pre-diabetes. Continue to recommend balanced, lower carb meals. Smaller meal size, adding snacks. Choosing water as drink of choice and increasing purposeful exercise.  All other labs normal and stable.   Recommend use of metformin xr 500 mg to assist with inflammation and blood sugar variation, if desired. 90# r0. 3 month f/u

## 2023-04-01 NOTE — Telephone Encounter (Signed)
Pt given lab results per notes of Robynn Pane, NP on 04/01/23. Pt verbalized understanding. Patient is agreeable to starting Metformin xr 500 MG. Patient would like the medication sent to [facility] in [place] on [address]  Jacky Kindle, [place] 04/01/2023  8:45 AM EST     Labs have been reviewed; borderline elevated LDL/bad cholesterol. I continue to recommend diet low in saturated fat and regular exercise - 30 min at least 5 times per week   A1c, 3 month average of blood sugar, now shows borderline pre-diabetes. Continue to recommend balanced, lower carb meals. Smaller meal size, adding snacks. Choosing water as drink of choice and increasing purposeful exercise.   All other labs normal and stable.   Recommend use of metformin xr 500 mg to assist with inflammation and blood sugar variation, if desired. 90# r0. 3 month f/u

## 2023-04-07 ENCOUNTER — Ambulatory Visit: Payer: BC Managed Care – PPO | Admitting: Family Medicine

## 2023-05-12 ENCOUNTER — Ambulatory Visit: Payer: BC Managed Care – PPO | Admitting: Family Medicine

## 2023-06-03 ENCOUNTER — Encounter: Payer: Self-pay | Admitting: Family Medicine

## 2023-06-03 ENCOUNTER — Ambulatory Visit: Payer: BC Managed Care – PPO | Admitting: Family Medicine

## 2023-06-03 VITALS — BP 133/89 | HR 79 | Temp 97.9°F | Resp 18 | Ht 70.0 in | Wt 194.0 lb

## 2023-06-03 DIAGNOSIS — R7303 Prediabetes: Secondary | ICD-10-CM

## 2023-06-03 DIAGNOSIS — F39 Unspecified mood [affective] disorder: Secondary | ICD-10-CM | POA: Diagnosis not present

## 2023-06-03 DIAGNOSIS — R42 Dizziness and giddiness: Secondary | ICD-10-CM

## 2023-06-03 DIAGNOSIS — L700 Acne vulgaris: Secondary | ICD-10-CM

## 2023-06-03 DIAGNOSIS — E282 Polycystic ovarian syndrome: Secondary | ICD-10-CM | POA: Diagnosis not present

## 2023-06-03 DIAGNOSIS — Z7689 Persons encountering health services in other specified circumstances: Secondary | ICD-10-CM

## 2023-06-03 MED ORDER — SERTRALINE HCL 50 MG PO TABS: 50.0000 mg | ORAL_TABLET | Freq: Every day | ORAL | 3 refills | Status: DC

## 2023-06-03 MED ORDER — METFORMIN HCL ER 500 MG PO TB24: 500.0000 mg | ORAL_TABLET | Freq: Every day | ORAL | 3 refills | Status: DC

## 2023-06-03 MED ORDER — DROSPIRENONE-ETHINYL ESTRADIOL 3-0.02 MG PO TABS: 1.0000 | ORAL_TABLET | Freq: Every day | ORAL | 4 refills | Status: DC

## 2023-06-03 MED ORDER — SPIRONOLACTONE 50 MG PO TABS: 50.0000 mg | ORAL_TABLET | Freq: Every day | ORAL | 3 refills | Status: DC

## 2023-06-03 NOTE — Progress Notes (Signed)
New Patient Office Visit  Subjective   Patient ID: Melissa Small, female    DOB: 1996-02-22  Age: 28 y.o. MRN: [id number]  CC:  Chief Complaint  Patient presents with   Mood Disorder   HPI Melissa Small is a 28 year old female who presents to establish with [facility] at [facility].   CC: Patient here to establish care  Last PCP: Merita Norton, FNP at [facility]  Specialists: none   Mood Disorder:  zoloft 50mg    Spironolactone 50mg  daily  Metformin: 500mg  daily  Helps with dizziness  Dizziness: "maybe better now" Sitting to standing, occurs in the morning      06/03/2023   10:06 AM 03/31/2023    9:58 AM 10/31/2019    3:18 PM  GAD 7 : Generalized Anxiety Score  Nervous, Anxious, on Edge 1 2 1   Control/stop worrying 1 1 0  Worry too much - different things 1 0 1  Trouble relaxing 0 2 0  Restless 0 1 0  Easily annoyed or irritable 1 3 3   Afraid - awful might happen 0 0 0  Total GAD 7 Score 4 9 5   Anxiety Difficulty Somewhat difficult Somewhat difficult Somewhat difficult      06/03/2023   10:06 AM 03/31/2023    9:58 AM 05/23/2021   10:54 AM  PHQ9 SCORE ONLY  PHQ-9 Total Score 4 4 6    Outpatient Encounter Medications as of 06/03/2023  Medication Sig   [DISCONTINUED] drospirenone-ethinyl estradiol (YAZ) 3-0.02 MG tablet Take 1 tablet by mouth daily.   [DISCONTINUED] metFORMIN (GLUCOPHAGE-XR) 500 MG 24 hr tablet Take 1 tablet (500 mg total) by mouth daily with breakfast.   [DISCONTINUED] sertraline (ZOLOFT) 50 MG tablet Take 1/2 tablet by mouth x 2 weeks; if tolerated GI wise- increase to 1 tablet daily.   [DISCONTINUED] spironolactone (ALDACTONE) 25 MG tablet Take 1 tablet (25 mg total) by mouth daily. (Patient taking differently: Take 25 mg by mouth 2 (two) times daily.)   drospirenone-ethinyl estradiol (YAZ) 3-0.02 MG tablet Take 1 tablet by mouth daily.   metFORMIN (GLUCOPHAGE-XR) 500 MG 24 hr tablet Take 1 tablet  (500 mg total) by mouth daily with breakfast.   sertraline (ZOLOFT) 50 MG tablet Take 1 tablet (50 mg total) by mouth daily.   spironolactone (ALDACTONE) 50 MG tablet Take 1 tablet (50 mg total) by mouth daily.   [DISCONTINUED] LORYNA 3-0.02 MG tablet    No facility-administered encounter medications on file as of 06/03/2023.    Patient Active Problem List   Diagnosis Date Noted   Mood disorder (HCC) 03/31/2023   Dizziness 03/31/2023   Elevated blood pressure reading in office without diagnosis of hypertension 03/31/2023   Hypoglycemia 03/31/2023   BMI 27.0-27.9,adult 03/31/2023   Surveillance for birth control, oral contraceptives 03/31/2023   Acne vulgaris 03/31/2023   Past Medical History:  Diagnosis Date   Anxiety    Past Surgical History:  Procedure Laterality Date   NO PAST SURGERIES     Family History  Problem Relation Age of Onset   Hypertension Mother    Arthritis Mother    Migraines Mother    Clotting disorder Mother        blood clots in leg   Fibromyalgia Mother    Depression Mother    Anxiety disorder Mother    Depression Father    Arthritis Maternal Grandmother    Hypertension Maternal Grandmother    Clotting disorder Maternal Grandmother  blood clots in leg and lung   Stroke Maternal Grandfather    Asthma Paternal Grandmother    Breast cancer Neg Hx    Ovarian cancer Neg Hx    Cervical cancer Neg Hx    Colon cancer Neg Hx    Social History   Socioeconomic History   Marital status: Single    Spouse name: Not on file   Number of children: Not on file   Years of education: Not on file   Highest education level: Some college, no degree  Occupational History   Not on file  Tobacco Use   Smoking status: Former    Types: E-cigarettes   Smokeless tobacco: Never  Substance and Sexual Activity   Alcohol use: No    Alcohol/week: 0.0 standard drinks of alcohol   Drug use: No   Sexual activity: Not Currently    Partners: Male    Birth  control/protection: I.U.D.  Other Topics Concern   Not on file  Social History Narrative   Not on file   Social Drivers of Health   Financial Resource Strain: Medium Risk (03/24/2023)   Overall Financial Resource Strain (CARDIA)    Difficulty of Paying Living Expenses: Somewhat hard  Food Insecurity: No Food Insecurity (03/24/2023)   Hunger Vital Sign    Worried About Running Out of Food in the Last Year: Never true    Ran Out of Food in the Last Year: Never true  Transportation Needs: No Transportation Needs (03/24/2023)   PRAPARE - Administrator, Civil Service (Medical): No    Lack of Transportation (Non-Medical): No  Physical Activity: Sufficiently Active (03/24/2023)   Exercise Vital Sign    Days of Exercise per Week: 5 days    Minutes of Exercise per Session: 30 min  Stress: No Stress Concern Present (03/24/2023)   [employer] of Occupational Health - Occupational Stress Questionnaire    Feeling of Stress : Only a little  Social Connections: Socially Isolated (03/24/2023)   Social Connection and Isolation Panel [NHANES]    Frequency of Communication with Friends and Family: More than three times a week    Frequency of Social Gatherings with Friends and Family: Once a week    Attends Religious Services: Never    Database administrator or Organizations: No    Attends Engineer, structural: Not on file    Marital Status: Never married  Intimate Partner Violence: Not on file   Outpatient Medications Prior to Visit  Medication Sig Dispense Refill   drospirenone-ethinyl estradiol (YAZ) 3-0.02 MG tablet Take 1 tablet by mouth daily. 84 tablet 4   metFORMIN (GLUCOPHAGE-XR) 500 MG 24 hr tablet Take 1 tablet (500 mg total) by mouth daily with breakfast. 90 tablet 3   sertraline (ZOLOFT) 50 MG tablet Take 1/2 tablet by mouth x 2 weeks; if tolerated GI wise- increase to 1 tablet daily. 90 tablet 0   spironolactone (ALDACTONE) 25 MG tablet Take 1 tablet (25  mg total) by mouth daily. (Patient taking differently: Take 25 mg by mouth 2 (two) times daily.) 90 tablet 3   LORYNA 3-0.02 MG tablet      No facility-administered medications prior to visit.   Allergies  Allergen Reactions   Meloxicam Nausea Only   ROS: see HPI   Objective   Today's Vitals   06/03/23 0956  BP: 133/89  Pulse: 79  Resp: 18  Temp: 97.9 F (36.6 C)  TempSrc: Oral  SpO2: 98%  Weight:  194 lb (88 kg)  Height: 5\' 10"  (1.778 m)  PainSc: 0-No pain   GENERAL: Well-appearing, in NAD. Well nourished.  SKIN: Pink, warm and dry. No rash, lesion, ulceration, or ecchymoses.  Head: Normocephalic. RESPIRATORY: Chest wall symmetrical. Respirations even and non-labored. Breath sounds clear to auscultation bilaterally.  CARDIAC: S1, S2 present, regular rate and rhythm without murmur or gallops. Peripheral pulses 2+ bilaterally.  MSK: Muscle tone and strength appropriate for age. Joints w/o tenderness, redness, or swelling.  EXTREMITIES: Without clubbing, cyanosis, or edema.  NEUROLOGIC: No motor or sensory deficits. Steady, even gait. C2-C12 intact.  PSYCH/MENTAL STATUS: Alert, oriented x 3. Cooperative, appropriate mood and affect.    Assessment & Plan:   1. Encounter to establish care (Primary) Patient is a 65- year-old female who presents today to establish care with primary care at [facility]. Reviewed the past medical history, family history, social history, surgical history, medications and allergies today- updates made as indicated.   2. PCOS (polycystic ovarian syndrome) Patient reports history of PCOS with symptoms of difficulty losing weight, elevated fasting glucose levels, and excessive acne.  She has been taking metformin 500 mg once a day and has tolerated this medication.  She would like to continue with current regimen.  Refill sent. - metFORMIN (GLUCOPHAGE-XR) 500 MG 24 hr tablet; Take 1 tablet (500 mg total) by mouth daily with breakfast.  Dispense: 90 tablet;  Refill: 3  3. Borderline pre-diabetes Review of labs from 03/31/2023 with hemoglobin A1c results of 5.6.  Patient was told she was prediabetic and started on metformin.  Her previous provider thought that this would help control her symptoms of dizziness.  Reviewed prediabetic A1c range with patient, and patient would like to continue with metformin at this time.  Refill sent to pharmacy on file. - metFORMIN (GLUCOPHAGE-XR) 500 MG 24 hr tablet; Take 1 tablet (500 mg total) by mouth daily with breakfast.  Dispense: 90 tablet; Refill: 3  4. Acne vulgaris Patient reports that her acne has improved on spironolactone and she wishes to continue this medication.  She reports increasing 25 mg (1 tablet) to taking 2 tablets, or 50 mg daily.  Will send in an updated prescription for 50 mg daily.  She would also like to continue with current birth control regimen. - spironolactone (ALDACTONE) 50 MG tablet; Take 1 tablet (50 mg total) by mouth daily.  Dispense: 90 tablet; Refill: 3 - drospirenone-ethinyl estradiol (YAZ) 3-0.02 MG tablet; Take 1 tablet by mouth daily.  Dispense: 84 tablet; Refill: 4  5. Mood disorder (HCC) Improvement in GAD and PHQ-9 scores.  Patient reports she is doing well on Zoloft 50 mg daily.  She would like to continue this medication regimen.  Refills sent. - sertraline (ZOLOFT) 50 MG tablet; Take 1 tablet (50 mg total) by mouth daily.  Dispense: 90 tablet; Refill: 3  6. Dizziness Patient saw previous provider for complaints of dizziness.  To assist with inflammation and blood sugar variation, patient was started on metformin 500 mg once daily.  She reports an improvement in her dizzy spell and would like to continue with current medication regimen.  No red flags present on exam.  Review of chart looks like this has been ongoing for the past 2.5 years with no clear etiology.  Discussed with patient further workup if these symptoms continue to persist or worsen.    Return in about 3  months (around 09/01/2023) for Mood f/u.   Alyson Reedy, FNP

## 2023-06-03 NOTE — Patient Instructions (Signed)
MyChart:  For all urgent or time sensitive needs we ask that you please call the office to avoid delays. Our number is [phone]. MyChart is not constantly monitored and due to the large volume of messages a day, replies may take up to 72 business hours.   MyChart Policy: MyChart allows for you to see your visit notes, after visit summary, provider recommendations, lab and tests results, make an appointment, request refills, and contact your provider or the office for non-urgent questions or concerns. Providers are seeing patients during normal business hours and do not have built in time to review MyChart messages.  We ask that you allow a minimum of 3 business days for responses to [employer]. For this reason, please do not send urgent requests through MyChart. Please call the office at [phone]. New and ongoing conditions may require a visit. We have virtual and in person visit available for your convenience.  Complex MyChart concerns may require a visit. Your provider may request you schedule a virtual or in person visit to ensure we are providing the best care possible. MyChart messages sent after 11:00 AM on Friday will not be received by the provider until Monday morning.    Lab and Test Results: You will receive your lab and test results on MyChart as soon as they are completed and results have been sent by the lab or testing facility. Due to this service, you will receive your results BEFORE your provider.  I review lab and tests results each morning prior to seeing patients. Some results require collaboration with other providers to ensure you are receiving the most appropriate care. For this reason, we ask that you please allow a minimum of 3-5 business days from the time the ALL results have been received for your provider to receive and review lab and test results and contact you about these.  Most lab and test result comments from the provider will be sent through MyChart.  Your provider may recommend changes to the plan of care, follow-up visits, repeat testing, ask questions, or request an office visit to discuss these results. You may reply directly to this message or call the office at [phone] to provide information for the provider or set up an appointment. In some instances, you will be called with test results and recommendations. Please let us know if this is preferred and we will make note of this in your chart to provide this for you.    If you have not heard a response to your lab or test results in 5 business days from all results returning to MyChart, please call the office to let us know. We ask that you please avoid calling prior to this time unless there is an emergent concern. Due to high call volumes, this can delay the resulting process.   After Hours: For all non-emergency after hours needs, please call the office at [phone] and select the option to reach the on-call provider service. On-call services are shared between multiple [place] offices and therefore it will not be possible to speak directly with your provider. On-call providers may provide medical advice and recommendations, but are unable to provide refills for maintenance medications.  For all emergency or urgent medical needs after normal business hours, we recommend that you seek care at the closest Urgent Care or Emergency Department to ensure appropriate treatment in a timely manner.  [facility] [place] has a 24 hour emergency room located on the ground floor for your  convenience.    Urgent Concerns During the Business Day Providers are seeing patients from 8AM to 5PM, Monday through Thursday, and 8AM to 12PM on Friday with a busy schedule and are most often not able to respond to non-urgent calls until the end of the day or the next business day. If you should have URGENT concerns during the day, please call and speak to the nurse or schedule a same day  appointment so that we can address your concern without delay.    Thank you, again, for choosing me as your health care partner. I appreciate your trust and look forward to learning more about you.    Alyson Reedy, FNP-C

## 2023-09-01 ENCOUNTER — Ambulatory Visit: Payer: BC Managed Care – PPO | Admitting: Family Medicine

## 2023-09-08 ENCOUNTER — Encounter: Payer: Self-pay | Admitting: Family Medicine

## 2023-09-08 ENCOUNTER — Ambulatory Visit: Admitting: Family Medicine

## 2023-09-08 VITALS — BP 122/85 | HR 61 | Resp 16 | Ht 70.0 in | Wt 191.4 lb

## 2023-09-08 DIAGNOSIS — R55 Syncope and collapse: Secondary | ICD-10-CM

## 2023-09-08 DIAGNOSIS — R42 Dizziness and giddiness: Secondary | ICD-10-CM | POA: Diagnosis not present

## 2023-09-08 DIAGNOSIS — F39 Unspecified mood [affective] disorder: Secondary | ICD-10-CM | POA: Diagnosis not present

## 2023-09-08 NOTE — Patient Instructions (Signed)

## 2023-09-08 NOTE — Progress Notes (Signed)
 Established Patient Office Visit  Subjective  Patient ID: Melissa Small, female    DOB: 12-23-1995  Age: 28 y.o. MRN: [id number]  Chief Complaint  Patient presents with   Mood Disorder   ANXIETY: Melissa Small presents for the medical management of anxiety.  Current medication regimen: zoloft  50mg  daily  Counseling: no Well controlled: yes, has improved  Denies SI/HI.      09/08/2023    1:40 PM 06/03/2023   10:06 AM 03/31/2023    9:58 AM 10/31/2019    3:18 PM  GAD 7 : Generalized Anxiety Score  Nervous, Anxious, on Edge 1 1 2 1   Control/stop worrying 0 1 1 0  Worry too much - different things 0 1 0 1  Trouble relaxing 0 0 2 0  Restless 0 0 1 0  Easily annoyed or irritable 0 1 3 3   Afraid - awful might happen 0 0 0 0  Total GAD 7 Score 1 4 9 5   Anxiety Difficulty Not difficult at all Somewhat difficult Somewhat difficult Somewhat difficult      09/08/2023    1:39 PM 06/03/2023   10:06 AM 03/31/2023    9:58 AM  PHQ9 SCORE ONLY  PHQ-9 Total Score 1 4 4     NEAR SYNCOPAL EPISODES: Occurs more frequently with physical activity  Happens almost every day  Doesn't matter how quick she gets up from sitting/crouching position to standing  Works as Curator doing physical labor   ROS: see HPI     Objective:    BP 122/85 (BP Location: Right Arm, Patient Position: Standing, Cuff Size: Normal)   Pulse 61   Resp 16   Ht 5\' 10"  (1.778 m)   Wt 191 lb 6.4 oz (86.8 kg)   LMP  (LMP Unknown)   SpO2 98%   BMI 27.46 kg/m  BP Readings from Last 3 Encounters:  09/08/23 122/85  06/03/23 133/89  05/23/21 123/81    Physical Exam Vitals reviewed.  Constitutional:      Appearance: Normal appearance.  Cardiovascular:     Rate and Rhythm: Normal rate and regular rhythm.     Pulses: Normal pulses.     Heart sounds: Normal heart sounds.  Pulmonary:     Effort: Pulmonary effort is normal.     Breath sounds: Normal breath sounds.  Neurological:     Mental Status: She  is alert.  Psychiatric:        Mood and Affect: Mood normal.        Behavior: Behavior normal.     Assessment & Plan:  1. Mood disorder (HCC) (Primary) GAD7 completed with score of 1. Denies issues with panic attacks, shortness of breath, difficulty breathing, palpitations, hyperventilation, and dizziness. PHQ9 completed with score of 1. Denies active or passive suicidal ideations.   Reports she is tolerating Zoloft  50mg  daily and would like to continue with current medication regimen. Discussed benefits of cognitive behavioral therapy (CBT) and is not interested at this time. Plan for  4-6 week follow-up. No refills needed at this time.    2. Postural dizziness with presyncope Orthostatics completed in office from sitting position 113/74 with HR 53 to standing 122/85 with HR 61. Negative orthostatic vitals. Patient in no acute distress and is well-appearing. Denies chest pain, shortness of breath, lower extremity edema, vision changes, headaches. Cardiovascular exam with heart regular rate and rhythm. Normal heart sounds, no murmurs present. No lower extremity edema present. Lungs clear to auscultation bilaterally. Patient thinks she  may have POTS. Reviewed previous labs performed with no acute abnormalities ruling out anemia, vitamin D  deficiency, vitamin B12 deficiency, and hypo- or hyperthyroidism. Normal kidney function and potassium levels. Due to dizziness occurring in position changes, would be reasonable for patient to wear 14-day Zio monitor. Follow-up in 2-4 weeks.    She is agreeable to this plan of care. Order placed. Will follow-up in 4 weeks.  - LONG TERM MONITOR (3-14 DAYS); Future   Return in about 4 weeks (around 10/06/2023) for Physical with fasting labs.    Wilhelmena Hanson, FNP

## 2023-10-06 ENCOUNTER — Ambulatory Visit

## 2023-10-06 ENCOUNTER — Encounter: Payer: Self-pay | Admitting: Family Medicine

## 2023-10-07 ENCOUNTER — Ambulatory Visit: Admitting: Family Medicine

## 2023-10-13 ENCOUNTER — Encounter: Admitting: Family Medicine

## 2024-05-27 ENCOUNTER — Other Ambulatory Visit: Payer: Self-pay | Admitting: Family Medicine

## 2024-05-27 DIAGNOSIS — L700 Acne vulgaris: Secondary | ICD-10-CM

## 2024-05-27 NOTE — Telephone Encounter (Signed)
 Please advise on refill request. Looks like pt might be overdue for an appt.

## 2024-06-04 ENCOUNTER — Encounter: Payer: Self-pay | Admitting: Family Medicine

## 2024-06-04 DIAGNOSIS — F39 Unspecified mood [affective] disorder: Secondary | ICD-10-CM

## 2024-06-04 DIAGNOSIS — L700 Acne vulgaris: Secondary | ICD-10-CM

## 2024-06-04 DIAGNOSIS — R7303 Prediabetes: Secondary | ICD-10-CM

## 2024-06-04 DIAGNOSIS — E282 Polycystic ovarian syndrome: Secondary | ICD-10-CM

## 2024-06-06 MED ORDER — DROSPIRENONE-ETHINYL ESTRADIOL 3-0.02 MG PO TABS: 1.0000 | ORAL_TABLET | Freq: Every day | ORAL | 4 refills | Status: AC

## 2024-06-06 MED ORDER — SERTRALINE HCL 50 MG PO TABS: 50.0000 mg | ORAL_TABLET | Freq: Every day | ORAL | 3 refills | Status: AC

## 2024-06-06 MED ORDER — METFORMIN HCL ER 500 MG PO TB24: 500.0000 mg | ORAL_TABLET | Freq: Every day | ORAL | 3 refills | Status: AC

## 2024-06-06 MED ORDER — SPIRONOLACTONE 50 MG PO TABS: 50.0000 mg | ORAL_TABLET | Freq: Every day | ORAL | 3 refills | Status: AC
